# Patient Record
Sex: Female | Born: 1980 | Race: Black or African American | Hispanic: No | Marital: Single | Smoking: Never smoker
Health system: Southern US, Community
[De-identification: ages and names within clinical notes are randomized; demographics above are authoritative.]

## PROBLEM LIST (undated history)

## (undated) DIAGNOSIS — E119 Type 2 diabetes mellitus without complications: Secondary | ICD-10-CM

## (undated) DIAGNOSIS — R0902 Hypoxemia: Secondary | ICD-10-CM

## (undated) DIAGNOSIS — I10 Essential (primary) hypertension: Secondary | ICD-10-CM

## (undated) HISTORY — PX: OOPHORECTOMY: SHX86

---

## 2011-03-08 ENCOUNTER — Inpatient Hospital Stay (HOSPITAL_COMMUNITY): Payer: Medicaid Other

## 2011-03-08 ENCOUNTER — Inpatient Hospital Stay (HOSPITAL_COMMUNITY)
Admission: AD | Admit: 2011-03-08 | Discharge: 2011-03-08 | Disposition: A | Discharge status: Home / Self Care | Payer: Medicaid Other | Source: Ambulatory Visit | Attending: Obstetrics & Gynecology | Admitting: Obstetrics & Gynecology

## 2011-03-08 ENCOUNTER — Emergency Department (HOSPITAL_BASED_OUTPATIENT_CLINIC_OR_DEPARTMENT_OTHER)
Admission: EM | Admit: 2011-03-08 | Discharge: 2011-03-08 | Disposition: A | Discharge status: Other Acute Inpatient Hospital | Payer: Medicaid Other | Attending: Emergency Medicine | Admitting: Emergency Medicine

## 2011-03-08 DIAGNOSIS — O209 Hemorrhage in early pregnancy, unspecified: Secondary | ICD-10-CM

## 2011-03-08 DIAGNOSIS — I10 Essential (primary) hypertension: Secondary | ICD-10-CM | POA: Insufficient documentation

## 2011-03-08 LAB — CBC
HCT: 30.2 % — ABNORMAL LOW (ref 36.0–46.0)
Hemoglobin: 9.5 g/dL — ABNORMAL LOW (ref 12.0–15.0)
MCHC: 31.5 g/dL (ref 30.0–36.0)

## 2011-03-08 LAB — ABO/RH: ABO/RH(D): B POS

## 2011-03-08 LAB — WET PREP, GENITAL
Trich, Wet Prep: NONE SEEN
Yeast Wet Prep HPF POC: NONE SEEN

## 2011-03-08 LAB — PREGNANCY, URINE: Preg Test, Ur: POSITIVE

## 2011-03-11 LAB — GC/CHLAMYDIA PROBE AMP, GENITAL: Chlamydia, DNA Probe: NEGATIVE

## 2014-04-04 ENCOUNTER — Encounter (HOSPITAL_BASED_OUTPATIENT_CLINIC_OR_DEPARTMENT_OTHER): Payer: Self-pay | Admitting: Emergency Medicine

## 2014-04-04 ENCOUNTER — Emergency Department (HOSPITAL_BASED_OUTPATIENT_CLINIC_OR_DEPARTMENT_OTHER)
Admission: EM | Admit: 2014-04-04 | Discharge: 2014-04-04 | Disposition: A | Discharge status: Home / Self Care | Payer: Medicaid Other | Attending: Emergency Medicine | Admitting: Emergency Medicine

## 2014-04-04 DIAGNOSIS — Y929 Unspecified place or not applicable: Secondary | ICD-10-CM | POA: Insufficient documentation

## 2014-04-04 DIAGNOSIS — T18108A Unspecified foreign body in esophagus causing other injury, initial encounter: Secondary | ICD-10-CM | POA: Insufficient documentation

## 2014-04-04 DIAGNOSIS — Y9389 Activity, other specified: Secondary | ICD-10-CM | POA: Insufficient documentation

## 2014-04-04 DIAGNOSIS — IMO0002 Reserved for concepts with insufficient information to code with codable children: Secondary | ICD-10-CM | POA: Insufficient documentation

## 2014-04-04 HISTORY — DX: Morbid (severe) obesity due to excess calories: E66.01

## 2014-04-04 HISTORY — DX: Type 2 diabetes mellitus without complications: E11.9

## 2014-04-04 HISTORY — DX: Essential (primary) hypertension: I10

## 2014-04-04 MED ORDER — ONDANSETRON 8 MG PO TBDP
8.0000 mg | ORAL_TABLET | Freq: Once | ORAL | Status: AC
  Administered 2014-04-04: 8 mg via ORAL
  Filled 2014-04-04: qty 1

## 2014-04-04 NOTE — ED Notes (Signed)
Pt states nausea is gone and is swallowing better

## 2014-04-04 NOTE — ED Provider Notes (Signed)
CSN: [id number]     Arrival date & time 04/04/14  0316 History   First MD Initiated Contact with Patient 04/04/14 0335     Chief Complaint  Patient presents with  . Chest Pain     (Consider location/radiation/quality/duration/timing/severity/associated sxs/prior Treatment) HPI This is a 33 year old female who took her evening medications about 11:30 PM. She immediately felt a sensation that there was something stuck in her upper esophagus. She describes this as a burning sensation, mild to moderate in severity and worse with swallowing. The burning sensation lasted about 30 minutes. She remains able to swallow but still has a foreign body sensation when swallowing liquids. She was not short of breath at any time. She has been nauseated but without vomiting.  No past medical history on file. History reviewed. No pertinent past surgical history. No family history on file. History  Substance Use Topics  . Smoking status: Not on file  . Smokeless tobacco: Not on file  . Alcohol Use: Not on file   OB History   Grav Para Term Preterm Abortions TAB SAB Ect Mult Living                 Review of Systems  All other systems reviewed and are negative.  Allergies  Review of patient's allergies indicates not on file.  Home Medications   Prior to Admission medications   Not on File   BP 150/77  Pulse 96  Temp(Src) 98.2 F (36.8 C) (Oral)  Resp 22  Ht 5\' 9"  (1.753 m)  Wt 460 lb (208.655 kg)  BMI 67.90 kg/m2  SpO2 100%  Physical Exam General: Well-developed, morbidly obese female in no acute distress; appearance consistent with age of record HENT: normocephalic; atraumatic Eyes: pupils equal, round and reactive to light; extraocular muscles intact Neck: supple Heart: regular rate and rhythm Lungs: clear to auscultation bilaterally Abdomen: soft; obese; nontender; bowel sounds present Extremities: No deformity; full range of motion; pulses normal Neurologic: Awake, alert and  oriented; motor function intact in all extremities and symmetric; no facial droop Skin: Warm and dry Psychiatric: Normal mood and affect    ED Course  Procedures (including critical care time)   EKG Interpretation   Date/Time:  Tuesday April 04 2014 03:25:33 EDT Ventricular Rate:  104 PR Interval:  160 QRS Duration: 86 QT Interval:  382 QTC Calculation: 502 R Axis:   58 Text Interpretation:  Sinus tachycardia Otherwise normal ECG No old  tracing to compare Confirmed by Anel Purohit  MD, Jonny Ruiz ([id number]) on 04/04/2014  3:34:32 AM      MDM  4:27 AM Nausea relieved with ODT Zofran.    Hanley Seamen, MD 04/04/14 [phone]

## 2014-04-04 NOTE — ED Notes (Signed)
C/o upper mid chest/throat pain after taking meds  Feels as if pill stuck  Able to swallow water  Denies pain at present

## 2014-04-04 NOTE — ED Notes (Signed)
Pt c/o upper mid chest pain  Onset after taking a pill States feels as if pill stuck in throat  Occurred around 1130 last pm  Is able to swallow water,

## 2014-04-04 NOTE — Discharge Instructions (Signed)
Swallowed Foreign Body, Adult You have swallowed an object (foreign body). Once the foreign body has passed through the food tube (esophagus), which leads from the mouth to the stomach, it will usually continue through the body without problems. This is because the point where the esophagus enters into the stomach is the narrowest place through which the foreign body must pass. Sometimes the foreign body gets stuck. The most common type of foreign body obstruction in adults is food impaction. Many times, bones from fish or meat products may become lodged in the esophagus or injure the throat on the way down. When there is an object that obstructs the esophagus, the most obvious symptoms are pain and the inability to swallow normally.  HOME CARE INSTRUCTIONS   If your caregiver says it is safe for you to eat, then only have liquids and soft foods until your symptoms improve.  Once you are eating normally:  Cut food into small pieces.  Remove small bones from food.  Remove large seeds and pits from fruit.  Chew your food well.  Do not talk, laugh, or engage in physical activity while eating or swallowing. SEEK MEDICAL CARE IF:  You develop worsening shortness of breath, uncontrollable coughing, chest pains or high fever, greater than 102 F (38.9 C).  You are unable to eat or drink or you feel that food is getting stuck in your throat.  You have choking symptoms or cannot stop drooling.  You develop abdominal pain, vomiting (especially of blood), or rectal bleeding. MAKE SURE YOU:   Understand these instructions.  Will watch your condition.  Will get help right away if you are not doing well or get worse. Document Released: 04/09/2010 Document Revised: 01/12/2012 Document Reviewed: 04/09/2010 [facility] Patient Information 2014 [place], [place].

## 2015-03-11 ENCOUNTER — Encounter (HOSPITAL_BASED_OUTPATIENT_CLINIC_OR_DEPARTMENT_OTHER): Payer: Self-pay | Admitting: Emergency Medicine

## 2015-03-11 DIAGNOSIS — E1165 Type 2 diabetes mellitus with hyperglycemia: Secondary | ICD-10-CM | POA: Diagnosis not present

## 2015-03-11 DIAGNOSIS — Z3202 Encounter for pregnancy test, result negative: Secondary | ICD-10-CM | POA: Insufficient documentation

## 2015-03-11 DIAGNOSIS — R251 Tremor, unspecified: Secondary | ICD-10-CM | POA: Diagnosis present

## 2015-03-11 DIAGNOSIS — Z794 Long term (current) use of insulin: Secondary | ICD-10-CM | POA: Insufficient documentation

## 2015-03-11 DIAGNOSIS — Z79899 Other long term (current) drug therapy: Secondary | ICD-10-CM | POA: Diagnosis not present

## 2015-03-11 DIAGNOSIS — I10 Essential (primary) hypertension: Secondary | ICD-10-CM | POA: Diagnosis not present

## 2015-03-11 DIAGNOSIS — Z9119 Patient's noncompliance with other medical treatment and regimen: Secondary | ICD-10-CM | POA: Diagnosis not present

## 2015-03-11 LAB — URINALYSIS, ROUTINE W REFLEX MICROSCOPIC
Bilirubin Urine: NEGATIVE
Hgb urine dipstick: NEGATIVE
KETONES UR: 15 mg/dL — AB
LEUKOCYTES UA: NEGATIVE
NITRITE: NEGATIVE
PH: 5.5 (ref 5.0–8.0)
Protein, ur: NEGATIVE mg/dL
SPECIFIC GRAVITY, URINE: 1.045 — AB (ref 1.005–1.030)
Urobilinogen, UA: 0.2 mg/dL (ref 0.0–1.0)

## 2015-03-11 LAB — CBG MONITORING, ED: GLUCOSE-CAPILLARY: 475 mg/dL — AB (ref 70–99)

## 2015-03-11 LAB — URINE MICROSCOPIC-ADD ON

## 2015-03-11 LAB — PREGNANCY, URINE: PREG TEST UR: NEGATIVE

## 2015-03-11 NOTE — ED Notes (Signed)
Pt states she hasn't taken her BP med in two days and hasn't taken her insulin in over 1 year

## 2015-03-11 NOTE — ED Notes (Signed)
Pt states she has been shaking for several days was going to come last night but her boyfriend came in to get checked out so she decided not to be seen last night.

## 2015-03-11 NOTE — ED Provider Notes (Signed)
CSN: [id number]     Arrival date & time 03/11/15  2220 History  This chart was scribed for Mabell Esguerra, MD by Roxy Cedarhandni Bhalodia, ED Scribe. This patient was seen in room MH08/MH08 and the patient's care was started at 12:37 AM.   Chief Complaint  Patient presents with  . Shaking   Patient is a 34 y.o. female presenting with diabetes problem. The history is provided by the patient. No language interpreter was used.  Diabetes This is a chronic problem. The current episode started more than 1 week ago. The problem occurs constantly. The problem has not changed since onset.Pertinent negatives include no chest pain, no abdominal pain, no headaches and no shortness of breath. Nothing aggravates the symptoms. Nothing relieves the symptoms. She has tried nothing for the symptoms. The treatment provided no relief.    HPI Comments: Rhonda Roth is a 34 y.o. female with a PMHx of diabetes, hypertension and obesity, who presents to the Emergency Department complaining of moderate jitteriness and polyuria that began a few days ago. Patient states that she has not taken her BP medication for the past 2 days and has not taken diabetic medication Novolog over the past year.  Patient is seen by Dr. Julian HyAlexander Borne.   Past Medical History  Diagnosis Date  . Diabetes mellitus without complication   . Hypertension   . Obesities, morbid    History reviewed. No pertinent past surgical history. History reviewed. No pertinent family history. History  Substance Use Topics  . Smoking status: Never Smoker   . Smokeless tobacco: Not on file  . Alcohol Use: No   OB History    No data available     Review of Systems  Constitutional: Negative for fever, diaphoresis and fatigue.  Respiratory: Negative for cough and shortness of breath.   Cardiovascular: Negative for chest pain, palpitations and leg swelling.  Gastrointestinal: Negative for abdominal pain.  Endocrine: Positive for polyuria.  Neurological:  Negative for headaches.  All other systems reviewed and are negative.  Allergies  Tetanus toxoids  Home Medications   Prior to Admission medications   Medication Sig Start Date End Date Taking? Authorizing Provider  insulin aspart (NOVOLOG) 100 UNIT/ML injection Inject 15 Units into the skin 3 (three) times daily before meals.   Yes Historical Provider, MD  insulin detemir (LEVEMIR) 100 UNIT/ML injection Inject into the skin at bedtime.   Yes Historical Provider, MD  Iron Combinations (IRON COMPLEX PO) Take by mouth.   Yes Historical Provider, MD  ramipril (ALTACE) 10 MG capsule Take 10 mg by mouth daily.    Historical Provider, MD  triamterene-hydrochlorothiazide (DYAZIDE) 37.5-25 MG per capsule Take 1 capsule by mouth daily.    Historical Provider, MD   Triage Vitals: BP 189/105 mmHg  Pulse 125  Temp(Src) 98.3 F (36.8 C) (Oral)  Resp 18  Ht 5\' 11"  (1.803 m)  Wt 500 lb (226.799 kg)  BMI 69.77 kg/m2  SpO2 100%  LMP 03/04/2015  Physical Exam  Constitutional: She is oriented to person, place, and time. She appears well-developed and well-nourished.  HENT:  Head: Normocephalic and atraumatic.  Mouth/Throat: Oropharynx is clear and moist. No oropharyngeal exudate.  Eyes: Pupils are equal, round, and reactive to light.  Neck: Neck supple.  Cardiovascular: Normal rate, regular rhythm and normal heart sounds.   Pulmonary/Chest: Effort normal and breath sounds normal. No respiratory distress. She has no wheezes. She has no rales.  Abdominal: Soft. Bowel sounds are normal. There is no tenderness. There  is no rebound and no guarding.  Musculoskeletal: Normal range of motion.  Neurological: She is alert and oriented to person, place, and time. She has normal reflexes.  Skin: Skin is warm and dry.  Psychiatric: She has a normal mood and affect. Her behavior is normal.  Nursing note and vitals reviewed.  ED Course  Procedures (including critical care time)  DIAGNOSTIC  STUDIES: Oxygen Saturation is 100% on RA, normal by my interpretation.    COORDINATION OF CARE: 12:42 AM- Discussed plans to order diagnostic lab work. Pt advised of plan for treatment and pt agrees.  Labs Review Labs Reviewed  URINALYSIS, ROUTINE W REFLEX MICROSCOPIC - Abnormal; Notable for the following:    Specific Gravity, Urine 1.045 (*)    Glucose, UA >1000 (*)    Ketones, ur 15 (*)    All other components within normal limits  CBG MONITORING, ED - Abnormal; Notable for the following:    Glucose-Capillary 475 (*)    All other components within normal limits  PREGNANCY, URINE  URINE MICROSCOPIC-ADD ON   Imaging Review No results found.   EKG Interpretation None     MDM   Final diagnoses:  None    Results for orders placed or performed during the hospital encounter of 03/12/15  Urinalysis, Routine w reflex microscopic  Result Value Ref Range   Color, Urine YELLOW YELLOW   APPearance CLEAR CLEAR   Specific Gravity, Urine 1.045 (H) 1.005 - 1.030   pH 5.5 5.0 - 8.0   Glucose, UA >1000 (A) NEGATIVE mg/dL   Hgb urine dipstick NEGATIVE NEGATIVE   Bilirubin Urine NEGATIVE NEGATIVE   Ketones, ur 15 (A) NEGATIVE mg/dL   Protein, ur NEGATIVE NEGATIVE mg/dL   Urobilinogen, UA 0.2 0.0 - 1.0 mg/dL   Nitrite NEGATIVE NEGATIVE   Leukocytes, UA NEGATIVE NEGATIVE  Pregnancy, urine  Result Value Ref Range   Preg Test, Ur NEGATIVE NEGATIVE  Urine microscopic-add on  Result Value Ref Range   Squamous Epithelial / LPF RARE RARE   WBC, UA 0-2 <3 WBC/hpf   RBC / HPF 0-2 <3 RBC/hpf   Bacteria, UA RARE RARE  CBC with Differential/Platelet  Result Value Ref Range   WBC 7.3 4.0 - 10.5 K/uL   RBC 5.35 (H) 3.87 - 5.11 MIL/uL   Hemoglobin 11.4 (L) 12.0 - 15.0 g/dL   HCT 16.1 (L) 09.6 - 04.5 %   MCV 66.5 (L) 78.0 - 100.0 fL   MCH 21.3 (L) 26.0 - 34.0 pg   MCHC 32.0 30.0 - 36.0 g/dL   RDW 40.9 (H) 81.1 - 91.4 %   Platelets 283 150 - 400 K/uL   Neutrophils Relative % 56 43 - 77  %   Neutro Abs 4.1 1.7 - 7.7 K/uL   Lymphocytes Relative 33 12 - 46 %   Lymphs Abs 2.4 0.7 - 4.0 K/uL   Monocytes Relative 6 3 - 12 %   Monocytes Absolute 0.5 0.1 - 1.0 K/uL   Eosinophils Relative 3 0 - 5 %   Eosinophils Absolute 0.3 0.0 - 0.7 K/uL   Basophils Relative 1 0 - 1 %   Basophils Absolute 0.1 0.0 - 0.1 K/uL   RBC Morphology ELLIPTOCYTES    Smear Review WHITE COUNT CONFIRMED ON SMEAR   Comprehensive metabolic panel  Result Value Ref Range   Sodium 134 (L) 135 - 145 mmol/L   Potassium 4.1 3.5 - 5.1 mmol/L   Chloride 95 (L) 101 - 111 mmol/L   CO2  29 22 - 32 mmol/L   Glucose, Bld 477 (H) 70 - 99 mg/dL   BUN 9 6 - 20 mg/dL   Creatinine, Ser 8.290.86 0.44 - 1.00 mg/dL   Calcium 8.7 (L) 8.9 - 10.3 mg/dL   Total Protein 7.6 6.5 - 8.1 g/dL   Albumin 3.5 3.5 - 5.0 g/dL   AST 26 15 - 41 U/L   ALT 34 14 - 54 U/L   Alkaline Phosphatase 144 (H) 38 - 126 U/L   Total Bilirubin 0.7 0.3 - 1.2 mg/dL   GFR calc non Af Amer >60 >60 mL/min   GFR calc Af Amer >60 >60 mL/min   Anion gap 10 5 - 15  CBG monitoring, ED  Result Value Ref Range   Glucose-Capillary 475 (H) 70 - 99 mg/dL   No results found.  Medications  sodium chloride 0.9 % bolus 1,000 mL (0 mLs Intravenous Stopped 03/12/15 0328)  metFORMIN (GLUCOPHAGE) tablet 1,000 mg (1,000 mg Oral Given 03/12/15 0415)  insulin regular (NOVOLIN R,HUMULIN R) 100 units/mL injection 4 Units (4 Units Subcutaneous Given 03/12/15 0415)   Will restart metformin as will not drop sugar too far too fast and have patient follow up with PMD for HbA1c and ongoing care of DM  I personally performed the services described in this documentation, which was scribed in my presence. The recorded information has been reviewed and is accurate.    Cy BlamerApril Nikeia Henkes, MD 03/12/15 [phone]

## 2015-03-11 NOTE — ED Notes (Signed)
BS 475

## 2015-03-12 ENCOUNTER — Encounter (HOSPITAL_BASED_OUTPATIENT_CLINIC_OR_DEPARTMENT_OTHER): Payer: Self-pay | Admitting: Emergency Medicine

## 2015-03-12 ENCOUNTER — Emergency Department (HOSPITAL_BASED_OUTPATIENT_CLINIC_OR_DEPARTMENT_OTHER)
Admission: EM | Admit: 2015-03-12 | Discharge: 2015-03-12 | Disposition: A | Discharge status: Home / Self Care | Payer: Medicaid Other | Attending: Emergency Medicine | Admitting: Emergency Medicine

## 2015-03-12 DIAGNOSIS — Z9114 Patient's other noncompliance with medication regimen: Secondary | ICD-10-CM

## 2015-03-12 DIAGNOSIS — E1165 Type 2 diabetes mellitus with hyperglycemia: Secondary | ICD-10-CM

## 2015-03-12 LAB — COMPREHENSIVE METABOLIC PANEL
ALT: 34 U/L (ref 14–54)
ANION GAP: 10 (ref 5–15)
AST: 26 U/L (ref 15–41)
Albumin: 3.5 g/dL (ref 3.5–5.0)
Alkaline Phosphatase: 144 U/L — ABNORMAL HIGH (ref 38–126)
BUN: 9 mg/dL (ref 6–20)
CALCIUM: 8.7 mg/dL — AB (ref 8.9–10.3)
CO2: 29 mmol/L (ref 22–32)
CREATININE: 0.86 mg/dL (ref 0.44–1.00)
Chloride: 95 mmol/L — ABNORMAL LOW (ref 101–111)
GLUCOSE: 477 mg/dL — AB (ref 70–99)
Potassium: 4.1 mmol/L (ref 3.5–5.1)
Sodium: 134 mmol/L — ABNORMAL LOW (ref 135–145)
Total Bilirubin: 0.7 mg/dL (ref 0.3–1.2)
Total Protein: 7.6 g/dL (ref 6.5–8.1)

## 2015-03-12 LAB — CBC WITH DIFFERENTIAL/PLATELET
Basophils Absolute: 0.1 10*3/uL (ref 0.0–0.1)
Basophils Relative: 1 % (ref 0–1)
EOS ABS: 0.3 10*3/uL (ref 0.0–0.7)
EOS PCT: 3 % (ref 0–5)
HEMATOCRIT: 35.6 % — AB (ref 36.0–46.0)
HEMOGLOBIN: 11.4 g/dL — AB (ref 12.0–15.0)
LYMPHS ABS: 2.4 10*3/uL (ref 0.7–4.0)
Lymphocytes Relative: 33 % (ref 12–46)
MCH: 21.3 pg — AB (ref 26.0–34.0)
MCHC: 32 g/dL (ref 30.0–36.0)
MCV: 66.5 fL — AB (ref 78.0–100.0)
MONO ABS: 0.5 10*3/uL (ref 0.1–1.0)
MONOS PCT: 6 % (ref 3–12)
Neutro Abs: 4.1 10*3/uL (ref 1.7–7.7)
Neutrophils Relative %: 56 % (ref 43–77)
PLATELETS: 283 10*3/uL (ref 150–400)
RBC: 5.35 MIL/uL — AB (ref 3.87–5.11)
RDW: 16.1 % — ABNORMAL HIGH (ref 11.5–15.5)
WBC: 7.3 10*3/uL (ref 4.0–10.5)

## 2015-03-12 MED ORDER — INSULIN REGULAR HUMAN 100 UNIT/ML IJ SOLN
4.0000 [IU] | Freq: Once | INTRAMUSCULAR | Status: AC
  Administered 2015-03-12: 4 [IU] via SUBCUTANEOUS
  Filled 2015-03-12: qty 1

## 2015-03-12 MED ORDER — METFORMIN HCL 500 MG PO TABS
1000.0000 mg | ORAL_TABLET | Freq: Once | ORAL | Status: AC
  Administered 2015-03-12: 1000 mg via ORAL
  Filled 2015-03-12: qty 2

## 2015-03-12 MED ORDER — METFORMIN HCL 850 MG PO TABS: 850.0000 mg | ORAL_TABLET | Freq: Two times a day (BID) | ORAL | Status: DC

## 2015-03-12 MED ORDER — SODIUM CHLORIDE 0.9 % IV BOLUS (SEPSIS)
1000.0000 mL | Freq: Once | INTRAVENOUS | Status: AC
  Administered 2015-03-12: 1000 mL via INTRAVENOUS

## 2015-03-12 NOTE — ED Notes (Signed)
MD with pt  

## 2015-03-12 NOTE — ED Notes (Signed)
Assumed care of patient from [place], [place]. Pt lying down resting quietly.

## 2015-03-12 NOTE — ED Notes (Signed)
Patient i.v removed per nurse request.

## 2015-03-12 NOTE — Discharge Instructions (Signed)

## 2015-04-04 DIAGNOSIS — R0902 Hypoxemia: Secondary | ICD-10-CM

## 2015-04-04 HISTORY — DX: Hypoxemia: R09.02

## 2015-04-11 ENCOUNTER — Other Ambulatory Visit (HOSPITAL_BASED_OUTPATIENT_CLINIC_OR_DEPARTMENT_OTHER): Payer: Self-pay

## 2015-04-11 ENCOUNTER — Emergency Department (HOSPITAL_BASED_OUTPATIENT_CLINIC_OR_DEPARTMENT_OTHER): Payer: Medicaid Other

## 2015-04-11 ENCOUNTER — Emergency Department (HOSPITAL_BASED_OUTPATIENT_CLINIC_OR_DEPARTMENT_OTHER)
Admit: 2015-04-11 | Discharge: 2015-04-11 | Disposition: A | Discharge status: Home / Self Care | Payer: Medicaid Other | Attending: Emergency Medicine | Admitting: Emergency Medicine

## 2015-04-11 ENCOUNTER — Encounter (HOSPITAL_BASED_OUTPATIENT_CLINIC_OR_DEPARTMENT_OTHER): Payer: Self-pay | Admitting: *Deleted

## 2015-04-11 ENCOUNTER — Inpatient Hospital Stay (HOSPITAL_BASED_OUTPATIENT_CLINIC_OR_DEPARTMENT_OTHER)
Admission: EM | Admit: 2015-04-11 | Discharge: 2015-04-14 | DRG: 175 | Disposition: A | Discharge status: Home / Self Care | Payer: Medicaid Other | Attending: Internal Medicine | Admitting: Internal Medicine

## 2015-04-11 DIAGNOSIS — I2699 Other pulmonary embolism without acute cor pulmonale: Principal | ICD-10-CM | POA: Diagnosis present

## 2015-04-11 DIAGNOSIS — L03116 Cellulitis of left lower limb: Secondary | ICD-10-CM | POA: Diagnosis present

## 2015-04-11 DIAGNOSIS — I10 Essential (primary) hypertension: Secondary | ICD-10-CM | POA: Diagnosis present

## 2015-04-11 DIAGNOSIS — J9601 Acute respiratory failure with hypoxia: Secondary | ICD-10-CM | POA: Diagnosis present

## 2015-04-11 DIAGNOSIS — Z6841 Body Mass Index (BMI) 40.0 and over, adult: Secondary | ICD-10-CM

## 2015-04-11 DIAGNOSIS — R0602 Shortness of breath: Secondary | ICD-10-CM | POA: Diagnosis present

## 2015-04-11 DIAGNOSIS — R0600 Dyspnea, unspecified: Secondary | ICD-10-CM | POA: Diagnosis not present

## 2015-04-11 DIAGNOSIS — Z794 Long term (current) use of insulin: Secondary | ICD-10-CM | POA: Diagnosis not present

## 2015-04-11 DIAGNOSIS — T502X5A Adverse effect of carbonic-anhydrase inhibitors, benzothiadiazides and other diuretics, initial encounter: Secondary | ICD-10-CM | POA: Diagnosis not present

## 2015-04-11 DIAGNOSIS — E119 Type 2 diabetes mellitus without complications: Secondary | ICD-10-CM | POA: Diagnosis present

## 2015-04-11 DIAGNOSIS — E876 Hypokalemia: Secondary | ICD-10-CM | POA: Diagnosis present

## 2015-04-11 DIAGNOSIS — Z887 Allergy status to serum and vaccine status: Secondary | ICD-10-CM | POA: Diagnosis not present

## 2015-04-11 DIAGNOSIS — Z713 Dietary counseling and surveillance: Secondary | ICD-10-CM | POA: Diagnosis present

## 2015-04-11 DIAGNOSIS — R0902 Hypoxemia: Secondary | ICD-10-CM | POA: Diagnosis not present

## 2015-04-11 HISTORY — DX: Hypoxemia: R09.02

## 2015-04-11 LAB — COMPREHENSIVE METABOLIC PANEL
ALK PHOS: 143 U/L — AB (ref 38–126)
ALT: 40 U/L (ref 14–54)
AST: 34 U/L (ref 15–41)
Albumin: 3.4 g/dL — ABNORMAL LOW (ref 3.5–5.0)
Anion gap: 8 (ref 5–15)
BILIRUBIN TOTAL: 0.2 mg/dL — AB (ref 0.3–1.2)
BUN: 11 mg/dL (ref 6–20)
CALCIUM: 8.1 mg/dL — AB (ref 8.9–10.3)
CHLORIDE: 100 mmol/L — AB (ref 101–111)
CO2: 25 mmol/L (ref 22–32)
Creatinine, Ser: 1.02 mg/dL — ABNORMAL HIGH (ref 0.44–1.00)
GLUCOSE: 343 mg/dL — AB (ref 65–99)
POTASSIUM: 3.5 mmol/L (ref 3.5–5.1)
Sodium: 133 mmol/L — ABNORMAL LOW (ref 135–145)
TOTAL PROTEIN: 7.8 g/dL (ref 6.5–8.1)

## 2015-04-11 LAB — URINE MICROSCOPIC-ADD ON

## 2015-04-11 LAB — DIFFERENTIAL
BASOS PCT: 1 % (ref 0–1)
Basophils Absolute: 0.1 10*3/uL (ref 0.0–0.1)
EOS ABS: 0.4 10*3/uL (ref 0.0–0.7)
EOS PCT: 5 % (ref 0–5)
LYMPHS ABS: 2.6 10*3/uL (ref 0.7–4.0)
LYMPHS PCT: 32 % (ref 12–46)
MONOS PCT: 8 % (ref 3–12)
Monocytes Absolute: 0.6 10*3/uL (ref 0.1–1.0)
Neutro Abs: 4.4 10*3/uL (ref 1.7–7.7)
Neutrophils Relative %: 55 % (ref 43–77)

## 2015-04-11 LAB — GLUCOSE, CAPILLARY: Glucose-Capillary: 256 mg/dL — ABNORMAL HIGH (ref 65–99)

## 2015-04-11 LAB — URINALYSIS, ROUTINE W REFLEX MICROSCOPIC
Bilirubin Urine: NEGATIVE
Hgb urine dipstick: NEGATIVE
Ketones, ur: NEGATIVE mg/dL
Nitrite: NEGATIVE
Protein, ur: NEGATIVE mg/dL
SPECIFIC GRAVITY, URINE: 1.035 — AB (ref 1.005–1.030)
UROBILINOGEN UA: 1 mg/dL (ref 0.0–1.0)
pH: 6 (ref 5.0–8.0)

## 2015-04-11 LAB — CBC
HEMATOCRIT: 37 % (ref 36.0–46.0)
Hemoglobin: 11.6 g/dL — ABNORMAL LOW (ref 12.0–15.0)
MCH: 21.5 pg — AB (ref 26.0–34.0)
MCHC: 31.4 g/dL (ref 30.0–36.0)
MCV: 68.6 fL — AB (ref 78.0–100.0)
PLATELETS: 287 10*3/uL (ref 150–400)
RBC: 5.39 MIL/uL — AB (ref 3.87–5.11)
RDW: 18.3 % — AB (ref 11.5–15.5)
WBC: 8 10*3/uL (ref 4.0–10.5)

## 2015-04-11 LAB — TROPONIN I: Troponin I: 0.03 ng/mL (ref <0.031)

## 2015-04-11 LAB — BRAIN NATRIURETIC PEPTIDE: B Natriuretic Peptide: 140.5 pg/mL — ABNORMAL HIGH (ref 0.0–100.0)

## 2015-04-11 LAB — PREGNANCY, URINE: PREG TEST UR: NEGATIVE

## 2015-04-11 LAB — D-DIMER, QUANTITATIVE (NOT AT ARMC): D DIMER QUANT: 1.58 ug{FEU}/mL — AB (ref 0.00–0.48)

## 2015-04-11 MED ORDER — INSULIN DETEMIR 100 UNIT/ML ~~LOC~~ SOLN
50.0000 [IU] | Freq: Every day | SUBCUTANEOUS | Status: DC
  Administered 2015-04-11 – 2015-04-12 (×2): 50 [IU] via SUBCUTANEOUS
  Filled 2015-04-11 (×3): qty 0.5

## 2015-04-11 MED ORDER — SODIUM CHLORIDE 0.45 % IV SOLN
INTRAVENOUS | Status: AC
  Administered 2015-04-11: via INTRAVENOUS

## 2015-04-11 MED ORDER — HEPARIN (PORCINE) IN NACL 100-0.45 UNIT/ML-% IJ SOLN
2450.0000 [IU]/h | INTRAMUSCULAR | Status: AC
  Administered 2015-04-11 – 2015-04-12 (×2): 2700 [IU]/h via INTRAVENOUS
  Administered 2015-04-12: 2600 [IU]/h via INTRAVENOUS
  Administered 2015-04-13: 2450 [IU]/h via INTRAVENOUS
  Filled 2015-04-11 (×7): qty 250

## 2015-04-11 MED ORDER — ALBUTEROL SULFATE (2.5 MG/3ML) 0.083% IN NEBU: 2.5000 mg | INHALATION_SOLUTION | RESPIRATORY_TRACT | Status: DC | PRN

## 2015-04-11 MED ORDER — ONDANSETRON HCL 4 MG PO TABS: 4.0000 mg | ORAL_TABLET | Freq: Four times a day (QID) | ORAL | Status: DC | PRN

## 2015-04-11 MED ORDER — INSULIN ASPART 100 UNIT/ML ~~LOC~~ SOLN
0.0000 [IU] | Freq: Three times a day (TID) | SUBCUTANEOUS | Status: DC
  Administered 2015-04-12 – 2015-04-13 (×4): 7 [IU] via SUBCUTANEOUS
  Administered 2015-04-13 – 2015-04-14 (×3): 4 [IU] via SUBCUTANEOUS
  Administered 2015-04-14: 7 [IU] via SUBCUTANEOUS

## 2015-04-11 MED ORDER — ACETAMINOPHEN 650 MG RE SUPP: 650.0000 mg | Freq: Four times a day (QID) | RECTAL | Status: DC | PRN

## 2015-04-11 MED ORDER — INSULIN ASPART 100 UNIT/ML ~~LOC~~ SOLN
0.0000 [IU] | Freq: Every day | SUBCUTANEOUS | Status: DC
  Administered 2015-04-11: 3 [IU] via SUBCUTANEOUS
  Administered 2015-04-12: 2 [IU] via SUBCUTANEOUS

## 2015-04-11 MED ORDER — SULFAMETHOXAZOLE-TRIMETHOPRIM 800-160 MG PO TABS
1.0000 | ORAL_TABLET | Freq: Two times a day (BID) | ORAL | Status: AC
  Administered 2015-04-11 – 2015-04-13 (×4): 1 via ORAL
  Filled 2015-04-11 (×4): qty 1

## 2015-04-11 MED ORDER — SODIUM CHLORIDE 0.9 % IJ SOLN
3.0000 mL | Freq: Two times a day (BID) | INTRAMUSCULAR | Status: DC
  Administered 2015-04-11 – 2015-04-14 (×6): 3 mL via INTRAVENOUS

## 2015-04-11 MED ORDER — ACETAMINOPHEN 325 MG PO TABS: 650.0000 mg | ORAL_TABLET | Freq: Four times a day (QID) | ORAL | Status: DC | PRN

## 2015-04-11 MED ORDER — ONDANSETRON HCL 4 MG/2ML IJ SOLN: 4.0000 mg | Freq: Four times a day (QID) | INTRAMUSCULAR | Status: DC | PRN

## 2015-04-11 MED ORDER — INSULIN ASPART 100 UNIT/ML ~~LOC~~ SOLN: 6.0000 [IU] | Freq: Three times a day (TID) | SUBCUTANEOUS | Status: DC

## 2015-04-11 MED ORDER — HEPARIN BOLUS VIA INFUSION
5000.0000 [IU] | Freq: Once | INTRAVENOUS | Status: AC
  Administered 2015-04-11: 5000 [IU] via INTRAVENOUS

## 2015-04-11 MED ORDER — RAMIPRIL 10 MG PO CAPS
10.0000 mg | ORAL_CAPSULE | Freq: Every day | ORAL | Status: DC
  Administered 2015-04-12 – 2015-04-14 (×3): 10 mg via ORAL
  Filled 2015-04-11 (×3): qty 1

## 2015-04-11 MED ORDER — INSULIN ASPART 100 UNIT/ML ~~LOC~~ SOLN
6.0000 [IU] | Freq: Three times a day (TID) | SUBCUTANEOUS | Status: DC
  Administered 2015-04-12 – 2015-04-13 (×4): 6 [IU] via SUBCUTANEOUS

## 2015-04-11 NOTE — ED Notes (Signed)
Pa  at bedside. 

## 2015-04-11 NOTE — ED Provider Notes (Signed)
CSN: [id number]     Arrival date & time 04/11/15  1557 History   First MD Initiated Contact with Patient 04/11/15 1559     Chief Complaint  Patient presents with  . Shortness of Breath     (Consider location/radiation/quality/duration/timing/severity/associated sxs/prior Treatment) HPI Comments: Patient is a 34 year old female past medical history significant for DM, HTN presented to the emergency department from her primary care physician for evaluation of 4 days of worsening shortness of breath. Patient endorses an associated intermittent nonproductive cough. She states her shortness of breath is worsened with exertion or lying flat. She states she has been treated for a left leg cellulitis and has noticed some swelling to the area, improving while on Bactrim, but endorses feeling "like a tube is in there." She states while she has been on the antibiotic she has had decreased mobility. Denies any associated fevers, chest pain, nausea, vomiting, diaphoresis. No modifying factors identified. No history of DVT or PE. Patient does not wear home oxygen.  Patient is a 34 y.o. female presenting with shortness of breath. The history is provided by the patient.  Shortness of Breath Severity:  Severe Onset quality:  Sudden Duration:  4 days Progression:  Worsening Relieved by:  Nothing Worsened by:  Movement and exertion Ineffective treatments:  None tried Associated symptoms: cough   Associated symptoms: no chest pain, no diaphoresis, no fever, no headaches, no syncope, no vomiting and no wheezing   Risk factors: obesity   Risk factors: no hx of PE/DVT, no oral contraceptive use and no recent surgery     Past Medical History  Diagnosis Date  . Diabetes mellitus without complication   . Hypertension   . Obesities, morbid    History reviewed. No pertinent past surgical history. History reviewed. No pertinent family history. History  Substance Use Topics  . Smoking status: Never Smoker   .  Smokeless tobacco: Not on file  . Alcohol Use: No   OB History    No data available     Review of Systems  Constitutional: Negative for fever and diaphoresis.  Respiratory: Positive for cough and shortness of breath. Negative for wheezing.   Cardiovascular: Negative for chest pain and syncope.  Gastrointestinal: Negative for vomiting.  Neurological: Negative for headaches.      Allergies  Tetanus toxoids  Home Medications   Prior to Admission medications   Medication Sig Start Date End Date Taking? Authorizing Provider  insulin aspart (NOVOLOG) 100 UNIT/ML injection Inject 15 Units into the skin 3 (three) times daily before meals.    Historical Provider, MD  insulin detemir (LEVEMIR) 100 UNIT/ML injection Inject into the skin at bedtime.    Historical Provider, MD  Iron Combinations (IRON COMPLEX PO) Take by mouth.    Historical Provider, MD  ramipril (ALTACE) 10 MG capsule Take 10 mg by mouth daily.    Historical Provider, MD  triamterene-hydrochlorothiazide (DYAZIDE) 37.5-25 MG per capsule Take 1 capsule by mouth daily.    Historical Provider, MD   BP 124/81 mmHg  Pulse 102  Temp(Src) 98.1 F (36.7 C) (Oral)  Resp 18  Ht  (1.803 m)  Wt 517 lb 3.2 oz (234.6 kg)  BMI 72.17 kg/m2  SpO2 99%  LMP 04/04/2015 Physical Exam  Constitutional: She is oriented to person, place, and time. She appears well-developed and well-nourished. No distress. Nasal cannula in place.  Examination limited by body habitus.   HENT:  Head: Normocephalic and atraumatic.  Right Ear: External ear normal.  Left Ear: External ear normal.  Nose: Nose normal.  Mouth/Throat: Oropharynx is clear and moist.  Eyes: Conjunctivae are normal.  Neck: Normal range of motion. Neck supple.  No nuchal rigidity.   Cardiovascular: Regular rhythm and normal heart sounds.  Tachycardia present.   Pulmonary/Chest: Effort normal and breath sounds normal. She has no wheezes.  Abdominal: Soft. There is no  tenderness.  Musculoskeletal: Normal range of motion. She exhibits no edema.  Neurological: She is alert and oriented to person, place, and time.  Skin: Skin is warm and dry. She is not diaphoretic.     Psychiatric: She has a normal mood and affect.  Nursing note and vitals reviewed.   ED Course  Procedures (including critical care time) Medications  heparin ADULT infusion 100 units/mL (25000 units/250 mL) (2,700 Units/hr Intravenous New Bag/Given 04/11/15 1945)  heparin bolus via infusion 5,000 Units (5,000 Units Intravenous Given 04/11/15 1945)    Labs Review Labs Reviewed  CBC - Abnormal; Notable for the following:    RBC 5.39 (*)    Hemoglobin 11.6 (*)    MCV 68.6 (*)    MCH 21.5 (*)    RDW 18.3 (*)    All other components within normal limits  COMPREHENSIVE METABOLIC PANEL - Abnormal; Notable for the following:    Sodium 133 (*)    Chloride 100 (*)    Glucose, Bld 343 (*)    Creatinine, Ser 1.02 (*)    Calcium 8.1 (*)    Albumin 3.4 (*)    Alkaline Phosphatase 143 (*)    Total Bilirubin 0.2 (*)    All other components within normal limits  BRAIN NATRIURETIC PEPTIDE - Abnormal; Notable for the following:    B Natriuretic Peptide 140.5 (*)    All other components within normal limits  URINALYSIS, ROUTINE W REFLEX MICROSCOPIC (NOT AT [facility]) - Abnormal; Notable for the following:    APPearance CLOUDY (*)    Specific Gravity, Urine 1.035 (*)    Glucose, UA >1000 (*)    Leukocytes, UA SMALL (*)    All other components within normal limits  D-DIMER, QUANTITATIVE (NOT AT [facility]) - Abnormal; Notable for the following:    D-Dimer, Quant 1.58 (*)    All other components within normal limits  TROPONIN I  PREGNANCY, URINE  DIFFERENTIAL  URINE MICROSCOPIC-ADD ON  HEPARIN LEVEL (UNFRACTIONATED)  CBC    Imaging Review [place] Venous Img Lower Bilateral  04/11/2015   CLINICAL DATA:  Shortness of breath for 4 days. LEFT lower extremity pain. Previous skin infection.  EXAM: BILATERAL  LOWER EXTREMITY VENOUS DOPPLER ULTRASOUND  TECHNIQUE: Gray-scale sonography with graded compression, as well as color Doppler and duplex ultrasound were performed to evaluate the lower extremity deep venous systems from the level of the common femoral vein and including the common femoral, femoral, profunda femoral, popliteal and calf veins including the posterior tibial, peroneal and gastrocnemius veins when visible. The superficial great saphenous vein was also interrogated. Spectral Doppler was utilized to evaluate flow at rest and with distal augmentation maneuvers in the common femoral, femoral and popliteal veins.  COMPARISON:  None.  FINDINGS: RIGHT LOWER EXTREMITY  Common Femoral Vein: No evidence of thrombus. Normal compressibility, respiratory phasicity and response to augmentation.  Saphenofemoral Junction: No evidence of thrombus. Normal compressibility and flow on color Doppler imaging.  Profunda Femoral Vein: No evidence of thrombus. Normal compressibility and flow on color Doppler imaging.  Femoral Vein: No evidence of thrombus. Normal compressibility, respiratory phasicity and response  to augmentation.  Popliteal Vein: No evidence of thrombus. Normal compressibility, respiratory phasicity and response to augmentation.  Calf Veins: No evidence of thrombus. Normal compressibility and flow on color Doppler imaging.  Superficial Great Saphenous Vein: No evidence of thrombus. Normal compressibility and flow on color Doppler imaging.  Venous Reflux:  None.  Other Findings:  None.  LEFT LOWER EXTREMITY  Common Femoral Vein: No evidence of thrombus. Normal compressibility, respiratory phasicity and response to augmentation.  Saphenofemoral Junction: No evidence of thrombus. Normal compressibility and flow on color Doppler imaging.  Profunda Femoral Vein: No evidence of thrombus. Normal compressibility and flow on color Doppler imaging.  Femoral Vein: No evidence of thrombus. Normal compressibility,  respiratory phasicity and response to augmentation.  Popliteal Vein: No evidence of thrombus. Normal compressibility, respiratory phasicity and response to augmentation.  Calf Veins: No evidence of thrombus. Normal compressibility and flow on color Doppler imaging.  Superficial Great Saphenous Vein: Intraluminal thrombus extends from the mid calf to the SFJ.  Venous Reflux:  None.  Other Findings:  None.  IMPRESSION: No evidence for RIGHT or LEFT deep venous thrombosis.  Superficial venous thrombosis involving the superficial great saphenous vein on the LEFT. Intraluminal thrombus extends from the mid calf to the SFJ.   Electronically Signed   By: Davonna Belling M.D.   On: 04/11/2015 20:13   Dg Chest Portable 1 View  04/11/2015   CLINICAL DATA:  34 year old female with shortness of breath for 4 days. Initial encounter.  EXAM: PORTABLE CHEST - 1 VIEW  COMPARISON:  [facility] chest radiographs 12/18/2009 report (no images available).  FINDINGS: Portable AP view at 1631 hours. Large body habitus. Normal cardiac size and mediastinal contours. Visualized tracheal air column is within normal limits. Allowing for portable technique, the lungs are clear. No pneumothorax or pleural effusion identified.  IMPRESSION: No acute cardiopulmonary abnormality.   Electronically Signed   By: Odessa Fleming M.D.   On: 04/11/2015 16:48     EKG Interpretation None      Patient dropped oxygen saturations to 77%-80% while changing into gown on RA.   Discussed patient with Dr. Rito Ehrlich of [facility] who will admit the patient in a transfer.   CRITICAL CARE Performed by: Francee Piccolo L   Total critical care time: 45 minutes  Critical care time was exclusive of separately billable procedures and treating other patients.  Critical care was necessary to treat or prevent imminent or life-threatening deterioration.  Critical care was time spent personally by me on the following activities: development of  treatment plan with patient and/or surrogate as well as nursing, discussions with consultants, evaluation of patient's response to treatment, examination of patient, obtaining history from patient or surrogate, ordering and performing treatments and interventions, ordering and review of laboratory studies, ordering and review of radiographic studies, pulse oximetry and re-evaluation of patient's condition.  MDM   Final diagnoses:  Hypoxia    Filed Vitals:   04/11/15 1835  BP: 124/81  Pulse: 102  Temp: 98.1 F (36.7 C)  Resp: 18    I have reviewed nursing notes, vital signs, and all appropriate lab and imaging results if ordered as above.   Patient presented to the emergency department from her primary care office for 4 days of worsening shortness of breath. On arrival she is afebrile, tachycardic to mid 110s to 120s and hypoxic on room air to 88%. Hypoxia improved on 2 L of nasal cannula. Lungs clear to auscultation. No obvious unilateral LE  edema or swelling. CXR reviewed. EKG reviewed. D-dimer elevated and [place] ordered wit evidence of large thrombus. Given concern from history and physical with lab findings suspicious for possible PE. Patient will require VQ scan for further evaluation of PE. Heparin will be ordered. Patient will be transferred and admitted to [facility] for further management and evaluation.  Patient d/w with Dr. Fayrene Fearing, agrees with plan.    Francee Piccolo, PA-C 04/11/15 2022  Rolland Porter, MD 04/20/15 [phone]

## 2015-04-11 NOTE — ED Notes (Signed)
Attempt to call report nurse not available 

## 2015-04-11 NOTE — ED Notes (Signed)
Pt brought in by ems form MD office for SOB x 4 days

## 2015-04-11 NOTE — H&P (Addendum)
[facility] History and Physical  Rhonda Roth ENI:[id number] DOB: 11-11-1980 DOA: 04/11/2015   PCP: Johnnette Barrios, MD  Specialists: None  Chief Complaint: Shortness of breath since Saturday  HPI: Rhonda Roth is a 34 y.o. female with a past medical history of for diabetes on insulin, morbid obesity, hypertension, who was in her usual state of health about 2-3 weeks ago when she started developing pain and swelling in the left leg, especially in the thigh area. She was noted to have symptoms suggestive of cellulitis. She was started on antibiotics. Initial antibiotics did not work so she was changed over to Bactrim. She still has about 2-3 days of antibiotics left. As a result of the cellulitis patient has had decreased mobility over the last of 10 days or so. On Saturday she wanted to get up and move around. She felt short of breath. This was mainly with exertion. She decided to wait out her symptoms. But the symptoms did not get any better. So she called her primary care physician's and was made an appointment for today. She was sent over to the emergency department. Over in the emergency department she was found to be tachycardic to 110s to 120s. Oxygen saturation was 88% on room air. Subsequently, we were called for hospitalization. Patient denies any chest pain, dizziness, nausea, vomiting. She's never had similar symptoms before. She denies any history of lung disease, heart disease. Denies any fever, chills. She did have one episode of cough which was dry. Did have some palpitations.  Home Medications: Prior to Admission medications   Medication Sig Start Date End Date Taking? Authorizing Provider  insulin aspart (NOVOLOG) 100 UNIT/ML injection Inject 15 Units into the skin 3 (three) times daily before meals.    Historical Provider, MD  insulin detemir (LEVEMIR) 100 UNIT/ML injection Inject into the skin at bedtime.    Historical Provider, MD  Iron Combinations (IRON  COMPLEX PO) Take by mouth.    Historical Provider, MD  ramipril (ALTACE) 10 MG capsule Take 10 mg by mouth daily.    Historical Provider, MD  triamterene-hydrochlorothiazide (DYAZIDE) 37.5-25 MG per capsule Take 1 capsule by mouth daily.    Historical Provider, MD    Allergies:  Allergies  Allergen Reactions  . Tetanus Toxoids Rash    Past Medical History: Past Medical History  Diagnosis Date  . Diabetes mellitus without complication   . Hypertension   . Obesities, morbid     She's had a right ovarian cyst and has had oophorectomy.  Social History: She lives in [place]. Denies smoking, alcohol use or illicit drug use. Usually she is independent.  Family History:  She mentions a history of diabetes in her grandfather. History of blood clots in her grandmother.  Review of Systems - History obtained from the patient General ROS: positive for  - fatigue Psychological ROS: negative Ophthalmic ROS: negative ENT ROS: negative Allergy and Immunology ROS: negative Hematological and Lymphatic ROS: negative Endocrine ROS: negative Respiratory ROS: as in hpi Cardiovascular ROS: as in hpi Gastrointestinal ROS: no abdominal pain, change in bowel habits, or black or bloody stools Genito-Urinary ROS: no dysuria, trouble voiding, or hematuria Musculoskeletal ROS: negative Neurological ROS: no TIA or stroke symptoms Dermatological ROS: negative  Physical Examination  Filed Vitals:   04/11/15 1605 04/11/15 1805 04/11/15 1835 04/11/15 2046  BP:   124/81 134/76  Pulse:   102 110  Temp:   98.1 F (36.7 C) 98.2 F (36.8 C)  TempSrc:   Oral  Oral  Resp:   18 18  Height:    _0  (1.803 m)  Weight:  234.6 kg (517 lb 3.2 oz)  234.328 kg (516 lb 9.6 oz)  SpO2: 100%  99% 98%    BP 134/76 mmHg  Pulse 110  Temp(Src) 98.2 F (36.8 C) (Oral)  Resp 18  Ht _1  (1.803 m)  Wt 234.328 kg (516 lb 9.6 oz)  BMI 72.08 kg/m2  SpO2 98%  LMP 04/04/2015  General appearance: alert,  cooperative, appears stated age, no distress and morbidly obese Head: Normocephalic, without obvious abnormality, atraumatic Eyes: conjunctivae/corneas clear. PERRL, EOM's intact.  Throat: lips, mucosa, and tongue normal; teeth and gums normal Neck: no adenopathy, no carotid bruit, no JVD, supple, symmetrical, trachea midline and thyroid not enlarged, symmetric, no tenderness/mass/nodules Back: symmetric, no curvature. ROM normal. No CVA tenderness. Resp: clear to auscultation bilaterally Cardio: regular rate and rhythm, S1, S2 normal, no murmur, click, rub or gallop GI: soft, non-tender; bowel sounds normal; no masses,  no organomegaly Extremities: extremities normal, atraumatic, no cyanosis or edema Pulses: 2+ and symmetric Skin: Skin color, texture, turgor normal. No rashes or lesions Lymph nodes: Cervical, supraclavicular, and axillary nodes normal. Neurologic: No focal deficits.  Laboratory Data: Results for orders placed or performed during the hospital encounter of 04/11/15 (from the past 48 hour(s))  CBC     Status: Abnormal   Collection Time: 04/11/15  4:00 PM  Result Value Ref Range   WBC 8.0 4.0 - 10.5 K/uL   RBC 5.39 (H) 3.87 - 5.11 MIL/uL   Hemoglobin 11.6 (L) 12.0 - 15.0 g/dL   HCT 37.0 36.0 - 46.0 %   MCV 68.6 (L) 78.0 - 100.0 fL   MCH 21.5 (L) 26.0 - 34.0 pg   MCHC 31.4 30.0 - 36.0 g/dL   RDW 18.3 (H) 11.5 - 15.5 %   Platelets 287 150 - 400 K/uL  Comprehensive metabolic panel     Status: Abnormal   Collection Time: 04/11/15  4:00 PM  Result Value Ref Range   Sodium 133 (L) 135 - 145 mmol/L   Potassium 3.5 3.5 - 5.1 mmol/L   Chloride 100 (L) 101 - 111 mmol/L   CO2 25 22 - 32 mmol/L   Glucose, Bld 343 (H) 65 - 99 mg/dL   BUN 11 6 - 20 mg/dL   Creatinine, Ser 1.02 (H) 0.44 - 1.00 mg/dL   Calcium 8.1 (L) 8.9 - 10.3 mg/dL   Total Protein 7.8 6.5 - 8.1 g/dL   Albumin 3.4 (L) 3.5 - 5.0 g/dL   AST 34 15 - 41 U/L   ALT 40 14 - 54 U/L   Alkaline Phosphatase 143 (H)  38 - 126 U/L   Total Bilirubin 0.2 (L) 0.3 - 1.2 mg/dL   GFR calc non Af Amer >60 >60 mL/min   GFR calc Af Amer >60 >60 mL/min    Comment: (NOTE) The eGFR has been calculated using the CKD EPI equation. This calculation has not been validated in all clinical situations. eGFR's persistently <60 mL/min signify possible Chronic Kidney Disease.    Anion gap 8 5 - 15  Brain natriuretic peptide     Status: Abnormal   Collection Time: 04/11/15  4:00 PM  Result Value Ref Range   B Natriuretic Peptide 140.5 (H) 0.0 - 100.0 pg/mL  Troponin I     Status: None   Collection Time: 04/11/15  4:00 PM  Result Value Ref Range   Troponin I 0.03 <  0.031 ng/mL    Comment:        NO INDICATION OF MYOCARDIAL INJURY.   Differential     Status: None   Collection Time: 04/11/15  4:00 PM  Result Value Ref Range   Neutrophils Relative % 55 43 - 77 %   Neutro Abs 4.4 1.7 - 7.7 K/uL   Lymphocytes Relative 32 12 - 46 %   Lymphs Abs 2.6 0.7 - 4.0 K/uL   Monocytes Relative 8 3 - 12 %   Monocytes Absolute 0.6 0.1 - 1.0 K/uL   Eosinophils Relative 5 0 - 5 %   Eosinophils Absolute 0.4 0.0 - 0.7 K/uL   Basophils Relative 1 0 - 1 %   Basophils Absolute 0.1 0.0 - 0.1 K/uL  D-dimer, quantitative     Status: Abnormal   Collection Time: 04/11/15  4:07 PM  Result Value Ref Range   D-Dimer, Quant 1.58 (H) 0.00 - 0.48 ug/mL-FEU    Comment:        AT THE INHOUSE ESTABLISHED CUTOFF VALUE OF 0.48 ug/mL FEU, THIS ASSAY HAS BEEN DOCUMENTED IN THE LITERATURE TO HAVE A SENSITIVITY AND NEGATIVE PREDICTIVE VALUE OF AT LEAST 98 TO 99%.  THE TEST RESULT SHOULD BE CORRELATED WITH AN ASSESSMENT OF THE CLINICAL PROBABILITY OF DVT / VTE.   Pregnancy, urine     Status: None   Collection Time: 04/11/15  6:00 PM  Result Value Ref Range   Preg Test, Ur NEGATIVE NEGATIVE    Comment:        THE SENSITIVITY OF THIS METHODOLOGY IS >20 mIU/mL.   Urinalysis, Routine w reflex microscopic (not at [facility])     Status: Abnormal    Collection Time: 04/11/15  6:00 PM  Result Value Ref Range   Color, Urine YELLOW YELLOW   APPearance CLOUDY (A) CLEAR   Specific Gravity, Urine 1.035 (H) 1.005 - 1.030   pH 6.0 5.0 - 8.0   Glucose, UA >1000 (A) NEGATIVE mg/dL   Hgb urine dipstick NEGATIVE NEGATIVE   Bilirubin Urine NEGATIVE NEGATIVE   Ketones, ur NEGATIVE NEGATIVE mg/dL   Protein, ur NEGATIVE NEGATIVE mg/dL   Urobilinogen, UA 1.0 0.0 - 1.0 mg/dL   Nitrite NEGATIVE NEGATIVE   Leukocytes, UA SMALL (A) NEGATIVE  Urine microscopic-add on     Status: None   Collection Time: 04/11/15  6:00 PM  Result Value Ref Range   Squamous Epithelial / LPF RARE RARE   WBC, UA 3-6 <3 WBC/hpf   RBC / HPF 0-2 <3 RBC/hpf   Bacteria, UA RARE RARE   Urine-Other MUCOUS PRESENT     Radiology Reports: US Venous Img Lower Bilateral  04/11/2015   CLINICAL DATA:  Shortness of breath for 4 days. LEFT lower extremity pain. Previous skin infection.  EXAM: BILATERAL LOWER EXTREMITY VENOUS DOPPLER ULTRASOUND  TECHNIQUE: Gray-scale sonography with graded compression, as well as color Doppler and duplex ultrasound were performed to evaluate the lower extremity deep venous systems from the level of the common femoral vein and including the common femoral, femoral, profunda femoral, popliteal and calf veins including the posterior tibial, peroneal and gastrocnemius veins when visible. The superficial great saphenous vein was also interrogated. Spectral Doppler was utilized to evaluate flow at rest and with distal augmentation maneuvers in the common femoral, femoral and popliteal veins.  COMPARISON:  None.  FINDINGS: RIGHT LOWER EXTREMITY  Common Femoral Vein: No evidence of thrombus. Normal compressibility, respiratory phasicity and response to augmentation.  Saphenofemoral Junction: No evidence of  thrombus. Normal compressibility and flow on color Doppler imaging.  Profunda Femoral Vein: No evidence of thrombus. Normal compressibility and flow on color Doppler  imaging.  Femoral Vein: No evidence of thrombus. Normal compressibility, respiratory phasicity and response to augmentation.  Popliteal Vein: No evidence of thrombus. Normal compressibility, respiratory phasicity and response to augmentation.  Calf Veins: No evidence of thrombus. Normal compressibility and flow on color Doppler imaging.  Superficial Great Saphenous Vein: No evidence of thrombus. Normal compressibility and flow on color Doppler imaging.  Venous Reflux:  None.  Other Findings:  None.  LEFT LOWER EXTREMITY  Common Femoral Vein: No evidence of thrombus. Normal compressibility, respiratory phasicity and response to augmentation.  Saphenofemoral Junction: No evidence of thrombus. Normal compressibility and flow on color Doppler imaging.  Profunda Femoral Vein: No evidence of thrombus. Normal compressibility and flow on color Doppler imaging.  Femoral Vein: No evidence of thrombus. Normal compressibility, respiratory phasicity and response to augmentation.  Popliteal Vein: No evidence of thrombus. Normal compressibility, respiratory phasicity and response to augmentation.  Calf Veins: No evidence of thrombus. Normal compressibility and flow on color Doppler imaging.  Superficial Great Saphenous Vein: Intraluminal thrombus extends from the mid calf to the SFJ.  Venous Reflux:  None.  Other Findings:  None.  IMPRESSION: No evidence for RIGHT or LEFT deep venous thrombosis.  Superficial venous thrombosis involving the superficial great saphenous vein on the LEFT. Intraluminal thrombus extends from the mid calf to the SFJ.   Electronically Signed   By: Rolla Flatten M.D.   On: 04/11/2015 20:13   Dg Chest Portable 1 View  04/11/2015   CLINICAL DATA:  34 year old female with shortness of breath for 4 days. Initial encounter.  EXAM: PORTABLE CHEST - 1 VIEW  COMPARISON:  [facility] chest radiographs 12/18/2009 report (no images available).  FINDINGS: Portable AP view at 1631 hours. Large body  habitus. Normal cardiac size and mediastinal contours. Visualized tracheal air column is within normal limits. Allowing for portable technique, the lungs are clear. No pneumothorax or pleural effusion identified.  IMPRESSION: No acute cardiopulmonary abnormality.   Electronically Signed   By: Genevie Ann M.D.   On: 04/11/2015 16:48    My interpretation of Electrocardiogram: Sinus tachycardia at 108 bpm. Normal axis. Intervals are normal. No concerning ST changes. T inversion noted in lead 3 and V3. These changes are new compared to EKG from 2015.  Problem List  Principal Problem:   Dyspnea Active Problems:   Hypoxia   Morbid obesity   DM type 2 (diabetes mellitus, type 2)   Acute respiratory failure with hypoxia   Assessment: This is a 34 year old morbidly obese African-American female who comes in with 4-5 day history of shortness of breath mainly with exertion. Chest x-ray was unremarkable. EKG shows nonspecific findings. Troponin is normal. She has multiple risk factors for having thromboembolic disease. Unfortunately, due to her weight she cannot undergo CT scan, but perhaps can undergo VQ scan.  Plan: #1 dyspnea with acute hypoxic respiratory failure: She will be admitted to the hospital. Monitor on telemetry. Due to the high pretest probability for pulmonary embolism, considering her morbid obesity and recent sedentary lifestyle, she will need to undergo further testing. We will place her on IV heparin for now. Due to her weight she cannot undergo CT scan. VQ scan has been ordered. We'll also order an echocardiogram. Repeat troponin. Oxygen.  #2 Sinus tachycardia: Could be due to the above. Check TSH.  #3 history of  diabetes mellitus type 2: Continue with her Levemir and place her on sliding scale coverage. Check HbA1c in the morning.  #4 history of essential hypertension: Monitor blood pressures. Continue altace  #5 Recent left lower extremity cellulitis, mainly involving the left thigh  area. He is to be improving. She still has a few more doses of Bactrim left. We will continue.  DVT Prophylaxis: On full dose heparin for now Code Status: Full code Family Communication: Discussed with the patient  Disposition Plan: Admit to telemetry   Further management decisions will depend on results of further testing and patient's response to treatment.   [facility]  [facility] Pager [phone]  If 7PM-7AM, please contact night-coverage [email or web address] Password [email or web address]  04/11/2015, 10:50 PM

## 2015-04-11 NOTE — ED Notes (Signed)
Pt in US

## 2015-04-11 NOTE — Progress Notes (Signed)
ANTICOAGULATION CONSULT NOTE - Initial Consult  Pharmacy Consult for Heparin Indication:  Rule out PE  Allergies  Allergen Reactions  . Tetanus Toxoids Rash    Patient Measurements: Height: 5\' 11"  (180.3 cm) Weight: (!) 517 lb 3.2 oz (234.6 kg) IBW/kg (Calculated) : 70.8 Heparin Dosing Weight: 134 kg  Vital Signs: Temp: 98.1 F (36.7 C) (06/08 1835) Temp Source: Oral (06/08 1835) BP: 124/81 mmHg (06/08 1835) Pulse Rate: 102 (06/08 1835)  Labs:  Recent Labs  04/11/15 1600  HGB 11.6*  HCT 37.0  PLT 287  CREATININE 1.02*  TROPONINI 0.03    Estimated Creatinine Clearance: 167.2 mL/min (by C-G formula based on Cr of 1.02).   Medical History: Past Medical History  Diagnosis Date  . Diabetes mellitus without complication   . Hypertension   . Obesities, morbid     Assessment: 34 year old female to begin heparin for rule out PE  Goal of Therapy:  Heparin level 0.3-0.7 units/ml Monitor platelets by anticoagulation protocol: Yes   Plan:  Heparin 5000 units iv x 1 bolus Heparin drip at 2700 units / hr Heparin level 6 hours after starts Daily heparin level, CBC  Thank you. Okey RegalLisa Cora Stetson, PharmD [phone]  Elwin SleightPowell, Karlon Schlafer Kay 04/11/2015,7:08 PM

## 2015-04-12 ENCOUNTER — Inpatient Hospital Stay (HOSPITAL_COMMUNITY): Payer: Medicaid Other

## 2015-04-12 DIAGNOSIS — R0600 Dyspnea, unspecified: Secondary | ICD-10-CM

## 2015-04-12 DIAGNOSIS — J9601 Acute respiratory failure with hypoxia: Secondary | ICD-10-CM

## 2015-04-12 DIAGNOSIS — E119 Type 2 diabetes mellitus without complications: Secondary | ICD-10-CM

## 2015-04-12 DIAGNOSIS — R0902 Hypoxemia: Secondary | ICD-10-CM

## 2015-04-12 LAB — CBC
HCT: 33.9 % — ABNORMAL LOW (ref 36.0–46.0)
HEMOGLOBIN: 10.6 g/dL — AB (ref 12.0–15.0)
MCH: 21.4 pg — AB (ref 26.0–34.0)
MCHC: 31.3 g/dL (ref 30.0–36.0)
MCV: 68.3 fL — AB (ref 78.0–100.0)
PLATELETS: 268 10*3/uL (ref 150–400)
RBC: 4.96 MIL/uL (ref 3.87–5.11)
RDW: 16.8 % — ABNORMAL HIGH (ref 11.5–15.5)
WBC: 8.1 10*3/uL (ref 4.0–10.5)

## 2015-04-12 LAB — GLUCOSE, CAPILLARY
GLUCOSE-CAPILLARY: 230 mg/dL — AB (ref 65–99)
Glucose-Capillary: 246 mg/dL — ABNORMAL HIGH (ref 65–99)
Glucose-Capillary: 220 mg/dL — ABNORMAL HIGH (ref 65–99)
Glucose-Capillary: 222 mg/dL — ABNORMAL HIGH (ref 65–99)

## 2015-04-12 LAB — COMPREHENSIVE METABOLIC PANEL
ALK PHOS: 120 U/L (ref 38–126)
ALT: 35 U/L (ref 14–54)
ANION GAP: 8 (ref 5–15)
AST: 31 U/L (ref 15–41)
Albumin: 2.7 g/dL — ABNORMAL LOW (ref 3.5–5.0)
BILIRUBIN TOTAL: 0.5 mg/dL (ref 0.3–1.2)
BUN: 5 mg/dL — ABNORMAL LOW (ref 6–20)
CHLORIDE: 102 mmol/L (ref 101–111)
CO2: 26 mmol/L (ref 22–32)
CREATININE: 0.89 mg/dL (ref 0.44–1.00)
Calcium: 8.2 mg/dL — ABNORMAL LOW (ref 8.9–10.3)
GFR calc Af Amer: >60 mL/min (ref >60)
GLUCOSE: 314 mg/dL — AB (ref 65–99)
POTASSIUM: 3.7 mmol/L (ref 3.5–5.1)
Sodium: 136 mmol/L (ref 135–145)
Total Protein: 6 g/dL — ABNORMAL LOW (ref 6.5–8.1)

## 2015-04-12 LAB — HEPARIN LEVEL (UNFRACTIONATED)
HEPARIN UNFRACTIONATED: 0.49 [IU]/mL (ref 0.30–0.70)
Heparin Unfractionated: 0.71 IU/mL — ABNORMAL HIGH (ref 0.30–0.70)
Heparin Unfractionated: 0.71 IU/mL — ABNORMAL HIGH (ref 0.30–0.70)

## 2015-04-12 LAB — TROPONIN I
TROPONIN I: 0.03 ng/mL (ref <0.031)
Troponin I: 0.03 ng/mL (ref <0.031)

## 2015-04-12 LAB — TSH: TSH: 1.501 u[IU]/mL (ref 0.350–4.500)

## 2015-04-12 MED ORDER — POTASSIUM CHLORIDE CRYS ER 20 MEQ PO TBCR
40.0000 meq | EXTENDED_RELEASE_TABLET | Freq: Once | ORAL | Status: AC
  Administered 2015-04-12: 40 meq via ORAL
  Filled 2015-04-12: qty 2

## 2015-04-12 MED ORDER — TECHNETIUM TO 99M ALBUMIN AGGREGATED
6.0000 | Freq: Once | INTRAVENOUS | Status: AC | PRN
  Administered 2015-04-12: 6 via INTRAVENOUS

## 2015-04-12 MED ORDER — TECHNETIUM TC 99M DIETHYLENETRIAME-PENTAACETIC ACID: 40.0000 | Freq: Once | INTRAVENOUS | Status: AC | PRN

## 2015-04-12 MED ORDER — FUROSEMIDE 10 MG/ML IJ SOLN
40.0000 mg | Freq: Once | INTRAMUSCULAR | Status: AC
  Administered 2015-04-12: 40 mg via INTRAVENOUS
  Filled 2015-04-12: qty 4

## 2015-04-12 NOTE — Progress Notes (Signed)
ANTICOAGULATION CONSULT NOTE - Follow Up Consult  Pharmacy Consult for Heparin Indication: pulmonary embolus  Allergies  Allergen Reactions  . Tetanus Toxoids Itching and Rash    Patient Measurements: Height: 5\' 11"  (180.3 cm) Weight: (!) 517 lb 6.4 oz (234.691 kg) (scale A) IBW/kg (Calculated) : 70.8 Heparin Dosing Weight: 134 kg  Vital Signs: Temp: 98 F (36.7 C) (06/09 1400) Temp Source: Oral (06/09 1400) BP: 111/72 mmHg (06/09 1400) Pulse Rate: 104 (06/09 1400)  Labs:  Recent Labs  04/11/15 1600 04/11/15 2328 04/12/15 0431 04/12/15 1228 04/12/15 1823  HGB 11.6*  --  10.6*  --   --   HCT 37.0  --  33.9*  --   --   PLT 287  --  268  --   --   HEPARINUNFRC  --   --  0.71* 0.49 0.71*  CREATININE 1.02*  --  0.89  --   --   TROPONINI 0.03 0.03 0.03 <0.03  --     Estimated Creatinine Clearance: 191.8 mL/min (by C-G formula based on Cr of 0.89).  Assessment: 34 year old female presents with SOB x several days. 2-3 wks ago she developed pain and swelling in the left leg (was started on abx for cellulitis). Tachycardic with O2 sats 88%.  Anticoagulation: SOB. VQ scan High probability for acute pulmonary embolus.. Hgb 11.6>10.6.   Heparin level slightly above goal this evening on recheck at 0.71. Will reduce rate. No bleeding issues or IV issues noted per nursing.  Goal of Therapy:  Heparin level 0.3-0.7 units/ml Monitor platelets by anticoagulation protocol: Yes   Plan:  Reduce IV heparin at 2450 units/hr Daily Heparin level  Sheppard Coil PharmD., BCPS Clinical Pharmacist Pager [phone] 04/12/2015 8:11 PM

## 2015-04-12 NOTE — Progress Notes (Signed)
[facility] PROGRESS NOTE   Rhonda Roth YDX:[id number] DOB: [birth year] DOA: 04/11/2015 PCP: Silas Flood, MD  HPI/Subjective: Patients feel more comfortable, still short of breath able to complete sentences. Denies any chest pain.  Assessment/Plan: Principal Problem:   Dyspnea Active Problems:   Hypoxia   Morbid obesity   DM type 2 (diabetes mellitus, type 2)   Acute respiratory failure with hypoxia   Acute PE Patient came to the hospital with hypoxia, oxygen saturation of 88%, and chest pain. Elevated d-dimer is, negative lower extremity Dopplers. Patient is morbidly obese cannot fit in CT scan, VQ was done. VQ showed high probability for PE, patient already on heparin. We'll switch to oral anticoagulation.  Acute hypoxic respiratory failure Patient presented with oxygen saturation of 88%, likely secondary to PE.  Diabetes mellitus type 2 Restart home medications, carbohydrate modified diet. Insulin sliding scale.  Morbid obesity Patient counseled extensively about weight loss. Instructed to try harder exercises and watch her weight.  Code Status: Full Code Family Communication: Plan discussed with the patient. Disposition Plan: Remains inpatient Diet: Diet Carb Modified Fluid consistency:: Thin; Room service appropriate?: Yes  Consultants:  None  Procedures:  None  Antibiotics:  None   Objective: Filed Vitals:   04/12/15 1400  BP: 111/72  Pulse: 104  Temp: 98 F (36.7 C)  Resp: 18    Intake/Output Summary (Last 24 hours) at 04/12/15 1503 Last data filed at 04/12/15 1332  Gross per 24 hour  Intake 1421.72 ml  Output    751 ml  Net 670.72 ml   Filed Weights   04/11/15 1805 04/11/15 2046 04/12/15 0452  Weight: 234.6 kg (517 lb 3.2 oz) 234.328 kg (516 lb 9.6 oz) 234.691 kg (517 lb 6.4 oz)    Exam: General: Alert and awake, oriented x3, not in any acute distress. HEENT: anicteric sclera, pupils reactive to light and  accommodation, EOMI CVS: S1-S2 clear, no murmur rubs or gallops Chest: clear to auscultation bilaterally, no wheezing, rales or rhonchi Abdomen: soft nontender, nondistended, normal bowel sounds, no organomegaly Extremities: no cyanosis, clubbing or edema noted bilaterally Neuro: Cranial nerves II-XII intact, no focal neurological deficits  Data Reviewed: Basic Metabolic Panel:  Recent Labs Lab 04/11/15 1600 04/12/15 0431  NA 133* 136  K 3.5 3.7  CL 100* 102  CO2 25 26  GLUCOSE 343* 314*  BUN 11 5*  CREATININE 1.02* 0.89  CALCIUM 8.1* 8.2*   Liver Function Tests:  Recent Labs Lab 04/11/15 1600 04/12/15 0431  AST 34 31  ALT 40 35  ALKPHOS 143* 120  BILITOT 0.2* 0.5  PROT 7.8 6.0*  ALBUMIN 3.4* 2.7*   No results for input(s): LIPASE, AMYLASE in the last 168 hours. No results for input(s): AMMONIA in the last 168 hours. CBC:  Recent Labs Lab 04/11/15 1600 04/12/15 0431  WBC 8.0 8.1  NEUTROABS 4.4  --   HGB 11.6* 10.6*  HCT 37.0 33.9*  MCV 68.6* 68.3*  PLT 287 268   Cardiac Enzymes:  Recent Labs Lab 04/11/15 1600 04/11/15 2328 04/12/15 0431 04/12/15 1228  TROPONINI 0.03 0.03 0.03 <0.03   BNP (last 3 results)  Recent Labs  04/11/15 1600  BNP 140.5*    ProBNP (last 3 results) No results for input(s): PROBNP in the last 8760 hours.  CBG:  Recent Labs Lab 04/11/15 2240 04/12/15 0627 04/12/15 1140  GLUCAP 256* 246* 220*    Micro No results found for this or any previous visit (from the past 240 hour(s)).  Studies: Nm Pulmonary Perf And Vent  04/12/2015   ADDENDUM REPORT: 04/12/2015 13:11  ADDENDUM: Critical Value/emergent results were called by telephone at the time of interpretation on 04/12/2015 at 1:10 pm to Dr. Dr. Arthor Captain, who verbally acknowledged these results.   Electronically Signed   By: Signa Kell M.D.   On: 04/12/2015 13:11   04/12/2015   CLINICAL DATA:  Shortness of breath since Saturday.  EXAM: NUCLEAR MEDICINE VENTILATION -  PERFUSION LUNG SCAN  TECHNIQUE: Ventilation images were obtained in multiple projections using inhaled aerosol Tc-49m DTPA. Perfusion images were obtained in multiple projections after intravenous injection of Tc-41m MAA.  RADIOPHARMACEUTICALS:  6.0 mCi Technetium-77m DTPA aerosol inhalation and 40.0 mCi Technetium-53m MAA IV  COMPARISON:  None.  FINDINGS: Chest radiograph:  No focal lung opacity identified.  Ventilation: No focal ventilation defect.  Perfusion: There are large bilateral segmental perfusion defects identified.  IMPRESSION: 1. High probability for acute pulmonary embolus.  Electronically Signed: By: Signa Kell M.D. On: 04/12/2015 13:03   US Venous Img Lower Bilateral  04/11/2015   CLINICAL DATA:  Shortness of breath for 4 days. LEFT lower extremity pain. Previous skin infection.  EXAM: BILATERAL LOWER EXTREMITY VENOUS DOPPLER ULTRASOUND  TECHNIQUE: Gray-scale sonography with graded compression, as well as color Doppler and duplex ultrasound were performed to evaluate the lower extremity deep venous systems from the level of the common femoral vein and including the common femoral, femoral, profunda femoral, popliteal and calf veins including the posterior tibial, peroneal and gastrocnemius veins when visible. The superficial great saphenous vein was also interrogated. Spectral Doppler was utilized to evaluate flow at rest and with distal augmentation maneuvers in the common femoral, femoral and popliteal veins.  COMPARISON:  None.  FINDINGS: RIGHT LOWER EXTREMITY  Common Femoral Vein: No evidence of thrombus. Normal compressibility, respiratory phasicity and response to augmentation.  Saphenofemoral Junction: No evidence of thrombus. Normal compressibility and flow on color Doppler imaging.  Profunda Femoral Vein: No evidence of thrombus. Normal compressibility and flow on color Doppler imaging.  Femoral Vein: No evidence of thrombus. Normal compressibility, respiratory phasicity and response  to augmentation.  Popliteal Vein: No evidence of thrombus. Normal compressibility, respiratory phasicity and response to augmentation.  Calf Veins: No evidence of thrombus. Normal compressibility and flow on color Doppler imaging.  Superficial Great Saphenous Vein: No evidence of thrombus. Normal compressibility and flow on color Doppler imaging.  Venous Reflux:  None.  Other Findings:  None.  LEFT LOWER EXTREMITY  Common Femoral Vein: No evidence of thrombus. Normal compressibility, respiratory phasicity and response to augmentation.  Saphenofemoral Junction: No evidence of thrombus. Normal compressibility and flow on color Doppler imaging.  Profunda Femoral Vein: No evidence of thrombus. Normal compressibility and flow on color Doppler imaging.  Femoral Vein: No evidence of thrombus. Normal compressibility, respiratory phasicity and response to augmentation.  Popliteal Vein: No evidence of thrombus. Normal compressibility, respiratory phasicity and response to augmentation.  Calf Veins: No evidence of thrombus. Normal compressibility and flow on color Doppler imaging.  Superficial Great Saphenous Vein: Intraluminal thrombus extends from the mid calf to the SFJ.  Venous Reflux:  None.  Other Findings:  None.  IMPRESSION: No evidence for RIGHT or LEFT deep venous thrombosis.  Superficial venous thrombosis involving the superficial great saphenous vein on the LEFT. Intraluminal thrombus extends from the mid calf to the SFJ.   Electronically Signed   By: Davonna Belling M.D.   On: 04/11/2015 20:13   Dg Chest Portable 1 View  04/11/2015   CLINICAL DATA:  34 year old female with shortness of breath for 4 days. Initial encounter.  EXAM: PORTABLE CHEST - 1 VIEW  COMPARISON:  [facility] chest radiographs 12/18/2009 report (no images available).  FINDINGS: Portable AP view at 1631 hours. Large body habitus. Normal cardiac size and mediastinal contours. Visualized tracheal air column is within normal limits.  Allowing for portable technique, the lungs are clear. No pneumothorax or pleural effusion identified.  IMPRESSION: No acute cardiopulmonary abnormality.   Electronically Signed   By: Odessa Fleming M.D.   On: 04/11/2015 16:48    Scheduled Meds: . insulin aspart  0-20 Units Subcutaneous TID WC  . insulin aspart  0-5 Units Subcutaneous QHS  . insulin aspart  6 Units Subcutaneous TID WC  . insulin detemir  50 Units Subcutaneous QHS  . ramipril  10 mg Oral Daily  . sodium chloride  3 mL Intravenous Q12H  . sulfamethoxazole-trimethoprim  1 tablet Oral Q12H   Continuous Infusions: . heparin 2,600 Units/hr (04/12/15 1321)       Time spent: 35 minutes    [facility]  [facility] Pager [phone] If 7PM-7AM, please contact night-coverage at [email or web address], password [facility] 04/12/2015, 3:03 PM  LOS: 1 day

## 2015-04-12 NOTE — Progress Notes (Signed)
  Echocardiogram 2D Echocardiogram has been performed.  Arvil Chaco 04/12/2015, 3:38 PM

## 2015-04-12 NOTE — Progress Notes (Signed)
ANTICOAGULATION CONSULT NOTE - Follow Up Consult  Pharmacy Consult for heparin Indication: r/o PE   Labs:  Recent Labs  04/11/15 1600 04/11/15 2328 04/12/15 0431  HGB 11.6*  --  10.6*  HCT 37.0  --  33.9*  PLT 287  --  268  HEPARINUNFRC  --   --  0.71*  CREATININE 1.02*  --   --   TROPONINI 0.03 0.03  --     Assessment: 33yo female slightly above goal with initial dosing for possible PE, awaiting VQ.  Goal of Therapy:  Heparin level 0.3-0.7 units/ml   Plan:  Will decrease heparin gtt slightly to 2600 units/hr and check level in 6hr.  Vernard Gambles, PharmD, BCPS  04/12/2015,6:13 AM

## 2015-04-12 NOTE — Progress Notes (Signed)
ANTICOAGULATION CONSULT NOTE - Follow Up Consult  Pharmacy Consult for Heparin Indication: pulmonary embolus  Allergies  Allergen Reactions  . Tetanus Toxoids Itching and Rash    Patient Measurements: Height: 5\' 11"  (180.3 cm) Weight: (!) 517 lb 6.4 oz (234.691 kg) (scale A) IBW/kg (Calculated) : 70.8 Heparin Dosing Weight: 134 kg  Vital Signs: Temp: 98.3 F (36.8 C) (06/09 1001) Temp Source: Oral (06/09 0452) BP: 122/68 mmHg (06/09 1001) Pulse Rate: 109 (06/09 1001)  Labs:  Recent Labs  04/11/15 1600 04/11/15 2328 04/12/15 0431 04/12/15 1228  HGB 11.6*  --  10.6*  --   HCT 37.0  --  33.9*  --   PLT 287  --  268  --   HEPARINUNFRC  --   --  0.71* 0.49  CREATININE 1.02*  --  0.89  --   TROPONINI 0.03 0.03 0.03  --     Estimated Creatinine Clearance: 191.8 mL/min (by C-G formula based on Cr of 0.89).  Assessment: 34 year old female presents with SOB x several days. 2-3 wks ago she developed pain and swelling in the left leg (was started on abx for cellulitis). Tachycardic with O2 sats 88%.  Anticoagulation: SOB. VQ scan High probability for acute pulmonary embolus.. Hgb 11.6>10.6. Heparin level 0.49 in goal.    Goal of Therapy:  Heparin level 0.3-0.7 units/ml Monitor platelets by anticoagulation protocol: Yes   Plan:  Continue IV heparin at 2600 units/hr Will reconfirm level in 6 hrs. SHOULD NOT START COUMADIN WHILE ON BACTRIM DUE TO SEVERE DRUG INTERACTION! fyi.  Merilynn Finland, [employer] 04/12/2015,1:28 PM

## 2015-04-13 ENCOUNTER — Encounter (HOSPITAL_COMMUNITY): Payer: Self-pay | Admitting: General Practice

## 2015-04-13 LAB — HEPARIN LEVEL (UNFRACTIONATED): Heparin Unfractionated: 0.38 IU/mL (ref 0.30–0.70)

## 2015-04-13 LAB — CBC
HCT: 34.2 % — ABNORMAL LOW (ref 36.0–46.0)
HEMOGLOBIN: 10.5 g/dL — AB (ref 12.0–15.0)
MCH: 21 pg — ABNORMAL LOW (ref 26.0–34.0)
MCHC: 30.7 g/dL (ref 30.0–36.0)
MCV: 68.5 fL — ABNORMAL LOW (ref 78.0–100.0)
Platelets: 292 10*3/uL (ref 150–400)
RBC: 4.99 MIL/uL (ref 3.87–5.11)
RDW: 16.8 % — AB (ref 11.5–15.5)
WBC: 9.3 10*3/uL (ref 4.0–10.5)

## 2015-04-13 LAB — GLUCOSE, CAPILLARY
GLUCOSE-CAPILLARY: 203 mg/dL — AB (ref 65–99)
Glucose-Capillary: 159 mg/dL — ABNORMAL HIGH (ref 65–99)
Glucose-Capillary: 153 mg/dL — ABNORMAL HIGH (ref 65–99)
Glucose-Capillary: 216 mg/dL — ABNORMAL HIGH (ref 65–99)

## 2015-04-13 LAB — BASIC METABOLIC PANEL
Anion gap: 10 (ref 5–15)
BUN: 5 mg/dL — AB (ref 6–20)
CALCIUM: 8.2 mg/dL — AB (ref 8.9–10.3)
CHLORIDE: 104 mmol/L (ref 101–111)
CO2: 24 mmol/L (ref 22–32)
Creatinine, Ser: 0.9 mg/dL (ref 0.44–1.00)
GFR calc Af Amer: >60 mL/min (ref >60)
GFR calc non Af Amer: >60 mL/min (ref >60)
GLUCOSE: 179 mg/dL — AB (ref 65–99)
POTASSIUM: 3.4 mmol/L — AB (ref 3.5–5.1)
Sodium: 138 mmol/L (ref 135–145)

## 2015-04-13 LAB — HEMOGLOBIN A1C
Hgb A1c MFr Bld: 15.6 % — ABNORMAL HIGH (ref 4.8–5.6)
MEAN PLASMA GLUCOSE: 401 mg/dL

## 2015-04-13 MED ORDER — RIVAROXABAN 15 MG PO TABS
15.0000 mg | ORAL_TABLET | Freq: Two times a day (BID) | ORAL | Status: DC
  Administered 2015-04-13 – 2015-04-14 (×2): 15 mg via ORAL
  Filled 2015-04-13 (×5): qty 1

## 2015-04-13 MED ORDER — PNEUMOCOCCAL VAC POLYVALENT 25 MCG/0.5ML IJ INJ
0.5000 mL | INJECTION | INTRAMUSCULAR | Status: AC
  Administered 2015-04-14: 0.5 mL via INTRAMUSCULAR
  Filled 2015-04-13: qty 0.5

## 2015-04-13 MED ORDER — POTASSIUM CHLORIDE CRYS ER 20 MEQ PO TBCR
40.0000 meq | EXTENDED_RELEASE_TABLET | Freq: Four times a day (QID) | ORAL | Status: AC
  Administered 2015-04-13 (×2): 40 meq via ORAL
  Filled 2015-04-13 (×2): qty 2

## 2015-04-13 MED ORDER — INSULIN DETEMIR 100 UNIT/ML ~~LOC~~ SOLN
60.0000 [IU] | Freq: Every day | SUBCUTANEOUS | Status: DC
  Administered 2015-04-13: 60 [IU] via SUBCUTANEOUS
  Filled 2015-04-13 (×2): qty 0.6

## 2015-04-13 MED ORDER — RIVAROXABAN 20 MG PO TABS
20.0000 mg | ORAL_TABLET | Freq: Every day | ORAL | Status: DC
  Filled 2015-04-13: qty 1

## 2015-04-13 MED ORDER — FUROSEMIDE 10 MG/ML IJ SOLN
40.0000 mg | Freq: Once | INTRAMUSCULAR | Status: AC
  Administered 2015-04-13: 40 mg via INTRAVENOUS
  Filled 2015-04-13: qty 4

## 2015-04-13 MED ORDER — FUROSEMIDE 10 MG/ML IJ SOLN: 40.0000 mg | Freq: Two times a day (BID) | INTRAMUSCULAR | Status: DC

## 2015-04-13 MED ORDER — INSULIN ASPART 100 UNIT/ML ~~LOC~~ SOLN
10.0000 [IU] | Freq: Three times a day (TID) | SUBCUTANEOUS | Status: DC
  Administered 2015-04-14 (×2): 10 [IU] via SUBCUTANEOUS

## 2015-04-13 MED ORDER — RIVAROXABAN 15 MG PO TABS
15.0000 mg | ORAL_TABLET | ORAL | Status: AC
  Administered 2015-04-13: 15 mg via ORAL
  Filled 2015-04-13: qty 1

## 2015-04-13 NOTE — Care Management Note (Signed)
Case Management Note  Patient Details  Name: Rhonda Roth MRN: [id number] Date of Birth: [birth year]  Subjective/Objective:        Admitted with PE            Action/Plan: CM following for DCP; patient has Medicaid / with prescription drug coverage  Expected Discharge Date:       Possibly 04/16/2015           Expected Discharge Plan:  Home/Self Care \ Discharge planning Services  CM Consult     Status of Service:  In process, will continue to follow  Reola Mosher [phone] 04/13/2015, 1:48 PM

## 2015-04-13 NOTE — Progress Notes (Signed)
ANTICOAGULATION CONSULT NOTE - Follow Up Consult  Pharmacy Consult for Heparin >> Xarelto Indication: pulmonary embolus  Allergies  Allergen Reactions  . Tetanus Toxoids Itching and Rash    Patient Measurements: Height: 5\' 11"  (180.3 cm) Weight: (!) 514 lb 9.6 oz (233.421 kg) (scale a) IBW/kg (Calculated) : 70.8 Heparin Dosing Weight: 134 kg  Vital Signs: Temp: 98.4 F (36.9 C) (06/10 0628) Temp Source: Oral (06/10 0628) BP: 121/78 mmHg (06/10 0938) Pulse Rate: 100 (06/10 0628)  Labs:  Recent Labs  04/11/15 1600 04/11/15 2328  04/12/15 0431 04/12/15 1228 04/12/15 1823 04/13/15 0316 04/13/15 0350 04/13/15 0805  HGB 11.6*  --   --  10.6*  --   --  10.5*  --   --   HCT 37.0  --   --  33.9*  --   --  34.2*  --   --   PLT 287  --   --  268  --   --  292  --   --   HEPARINUNFRC  --   --   < > 0.71* 0.49 0.71*  --  0.38  --   CREATININE 1.02*  --   --  0.89  --   --   --   --  0.90  TROPONINI 0.03 0.03  --  0.03 <0.03  --   --   --   --   < > = values in this interval not displayed.  Estimated Creatinine Clearance: 188.8 mL/min (by C-G formula based on Cr of 0.9).  Assessment: 66 YOF presents who presented on 6/8 with SOB x several days. 2-3 wks ago she developed pain and swelling in the left leg (was started on abx for cellulitis). Tachycardic with O2 sats 88%. VQ shows high probability for acute pulmonary embolus. Pharmacy consulted on 6/8 to start heparin for anticoagulation.  Heparin level this morning was therapeutic at 0.38 however plans are to transition the patient to Xarelto this morning. CBC stable, no bleeding noted.   The patient was educated on Xarelto this morning  Goal of Therapy:  Appropriate anticoagulation for indication and renal/hepatic function   Plan:  1. Discontinue heparin this AM 2. At time of heparin discontinuation, start Xarelto 15 mg twice daily with meals for 21 days (thru 6/30) 3. Starting on 7/1, transition to taking Xarelto 20 mg  once daily with supper 4. Will monitor for signs and symptoms of bleeding, renal/hepatic function for any necessary adjustments  Georgina Pillion, PharmD, BCPS Clinical Pharmacist Pager: [phone] 04/13/2015 10:05 AM

## 2015-04-13 NOTE — Clinical Documentation Improvement (Signed)
Potassium results below.  Potassium chloride SA 40 mEq x 2 doses ordered 04/13/2015.  Please identify any clinical conditions associated with the abnormal potassium value and document in your progress note and carry over to the discharge summary.    Component      Potassium  Latest Ref Rng      3.5 - 5.1 mmol/L  04/11/2015     4:00 PM 3.5  04/12/2015     4:31 AM 3.7  04/13/2015     8:05 AM 3.4 (L)   Possible Clinical Conditions: -Hypokalemia -Other condition (please specify) -Unable to determine at present  Thank you, Doy Mince, RN [phone] Clinical Documentation Specialist

## 2015-04-13 NOTE — Progress Notes (Signed)
[facility] PROGRESS NOTE   Rhonda Roth EAV:[id number] DOB: 23-May-1981 DOA: 04/11/2015 PCP: Silas Flood, MD  HPI/Subjective: Some shortness of breath but denies chest pain. Still on heparin drip, discontinue heparin and start on Xarelto, ambulate today.  Assessment/Plan: Principal Problem:   Dyspnea Active Problems:   Hypoxia   Morbid obesity   DM type 2 (diabetes mellitus, type 2)   Acute respiratory failure with hypoxia   Acute PE Patient came to the hospital with hypoxia, oxygen saturation of 88%, and chest pain. Elevated d-dimer is, negative lower extremity Dopplers. Patient is morbidly obese cannot fit in CT scan, VQ was done. VQ showed high probability for PE, patient already on heparin. Started on Xarelto, discontinued IV heparin.  Acute hypoxic respiratory failure Patient presented with oxygen saturation of 88%, likely secondary to PE. This is resolved, currently sats okay in room air  Diabetes mellitus type 2 Restart home medications, carbohydrate modified diet. Hemoglobin A1c is 15.6, this correlates with mean plasma glucose of 401. Patient reported that she was not able to get her medication because of lack of insurance, she is back on Medicaid. We'll increase the Levemir to 60 units at night, add NovoLog with meals.  Morbid obesity Patient counseled extensively about weight loss. Instructed to try water exercises and watch her diet.  Code Status: Full Code Family Communication: Plan discussed with the patient. Disposition Plan: Remains inpatient Diet: Diet Carb Modified Fluid consistency:: Thin; Room service appropriate?: Yes  Consultants:  None  Procedures:  None  Antibiotics:  None   Objective: Filed Vitals:   04/13/15 0938  BP: 121/78  Pulse:   Temp:   Resp:     Intake/Output Summary (Last 24 hours) at 04/13/15 1132 Last data filed at 04/13/15 1009  Gross per 24 hour  Intake 1699.35 ml  Output   2050 ml  Net  -350.65 ml   Filed Weights   04/11/15 2046 04/12/15 0452 04/13/15 0628  Weight: 234.328 kg (516 lb 9.6 oz) 234.691 kg (517 lb 6.4 oz) 233.421 kg (514 lb 9.6 oz)    Exam: General: Alert and awake, oriented x3, not in any acute distress. HEENT: anicteric sclera, pupils reactive to light and accommodation, EOMI CVS: S1-S2 clear, no murmur rubs or gallops Chest: clear to auscultation bilaterally, no wheezing, rales or rhonchi Abdomen: soft nontender, nondistended, normal bowel sounds, no organomegaly Extremities: no cyanosis, clubbing or edema noted bilaterally Neuro: Cranial nerves II-XII intact, no focal neurological deficits  Data Reviewed: Basic Metabolic Panel:  Recent Labs Lab 04/11/15 1600 04/12/15 0431 04/13/15 0805  NA 133* 136 138  K 3.5 3.7 3.4*  CL 100* 102 104  CO2 GLUCOSE 343* 314* 179*  BUN 11 5* 5*  CREATININE 1.02* 0.89 0.90  CALCIUM 8.1* 8.2* 8.2*   Liver Function Tests:  Recent Labs Lab 04/11/15 1600 04/12/15 0431  AST 34 31  ALT 40 35  ALKPHOS 143* 120  BILITOT 0.2* 0.5  PROT 7.8 6.0*  ALBUMIN 3.4* 2.7*   No results for input(s): LIPASE, AMYLASE in the last 168 hours. No results for input(s): AMMONIA in the last 168 hours. CBC:  Recent Labs Lab 04/11/15 1600 04/12/15 0431 04/13/15 0316  WBC 8.0 8.1 9.3  NEUTROABS 4.4  --   --   HGB 11.6* 10.6* 10.5*  HCT 37.0 33.9* 34.2*  MCV 68.6* 68.3* 68.5*  PLT 287 268 292   Cardiac Enzymes:  Recent Labs Lab 04/11/15 1600 04/11/15 2328 04/12/15 0431 04/12/15 1228  TROPONINI 0.03 0.03 0.03 <0.03   BNP (last 3 results)  Recent Labs  04/11/15 1600  BNP 140.5*    ProBNP (last 3 results) No results for input(s): PROBNP in the last 8760 hours.  CBG:  Recent Labs Lab 04/12/15 1140 04/12/15 1636 04/12/15 2149 04/13/15 0625 04/13/15 1116  GLUCAP 220* 222* 230* 159* 153*    Micro No results found for this or any previous visit (from the past 240 hour(s)).    Studies: Nm Pulmonary Perf And Vent  04/12/2015   ADDENDUM REPORT: 04/12/2015 13:11  ADDENDUM: Critical Value/emergent results were called by telephone at the time of interpretation on 04/12/2015 at 1:10 pm to Dr. Dr. Arthor Captain, who verbally acknowledged these results.   Electronically Signed   By: Signa Kell M.D.   On: 04/12/2015 13:11   04/12/2015   CLINICAL DATA:  Shortness of breath since Saturday.  EXAM: NUCLEAR MEDICINE VENTILATION - PERFUSION LUNG SCAN  TECHNIQUE: Ventilation images were obtained in multiple projections using inhaled aerosol Tc-3m DTPA. Perfusion images were obtained in multiple projections after intravenous injection of Tc-17m MAA.  RADIOPHARMACEUTICALS:  6.0 mCi Technetium-41m DTPA aerosol inhalation and 40.0 mCi Technetium-48m MAA IV  COMPARISON:  None.  FINDINGS: Chest radiograph:  No focal lung opacity identified.  Ventilation: No focal ventilation defect.  Perfusion: There are large bilateral segmental perfusion defects identified.  IMPRESSION: 1. High probability for acute pulmonary embolus.  Electronically Signed: By: Signa Kell M.D. On: 04/12/2015 13:03   US Venous Img Lower Bilateral  04/11/2015   CLINICAL DATA:  Shortness of breath for 4 days. LEFT lower extremity pain. Previous skin infection.  EXAM: BILATERAL LOWER EXTREMITY VENOUS DOPPLER ULTRASOUND  TECHNIQUE: Gray-scale sonography with graded compression, as well as color Doppler and duplex ultrasound were performed to evaluate the lower extremity deep venous systems from the level of the common femoral vein and including the common femoral, femoral, profunda femoral, popliteal and calf veins including the posterior tibial, peroneal and gastrocnemius veins when visible. The superficial great saphenous vein was also interrogated. Spectral Doppler was utilized to evaluate flow at rest and with distal augmentation maneuvers in the common femoral, femoral and popliteal veins.  COMPARISON:  None.  FINDINGS: RIGHT LOWER  EXTREMITY  Common Femoral Vein: No evidence of thrombus. Normal compressibility, respiratory phasicity and response to augmentation.  Saphenofemoral Junction: No evidence of thrombus. Normal compressibility and flow on color Doppler imaging.  Profunda Femoral Vein: No evidence of thrombus. Normal compressibility and flow on color Doppler imaging.  Femoral Vein: No evidence of thrombus. Normal compressibility, respiratory phasicity and response to augmentation.  Popliteal Vein: No evidence of thrombus. Normal compressibility, respiratory phasicity and response to augmentation.  Calf Veins: No evidence of thrombus. Normal compressibility and flow on color Doppler imaging.  Superficial Great Saphenous Vein: No evidence of thrombus. Normal compressibility and flow on color Doppler imaging.  Venous Reflux:  None.  Other Findings:  None.  LEFT LOWER EXTREMITY  Common Femoral Vein: No evidence of thrombus. Normal compressibility, respiratory phasicity and response to augmentation.  Saphenofemoral Junction: No evidence of thrombus. Normal compressibility and flow on color Doppler imaging.  Profunda Femoral Vein: No evidence of thrombus. Normal compressibility and flow on color Doppler imaging.  Femoral Vein: No evidence of thrombus. Normal compressibility, respiratory phasicity and response to augmentation.  Popliteal Vein: No evidence of thrombus. Normal compressibility, respiratory phasicity and response to augmentation.  Calf Veins: No evidence of thrombus. Normal compressibility and flow on color Doppler imaging.  Superficial  Great Saphenous Vein: Intraluminal thrombus extends from the mid calf to the SFJ.  Venous Reflux:  None.  Other Findings:  None.  IMPRESSION: No evidence for RIGHT or LEFT deep venous thrombosis.  Superficial venous thrombosis involving the superficial great saphenous vein on the LEFT. Intraluminal thrombus extends from the mid calf to the SFJ.   Electronically Signed   By: Davonna Belling M.D.   On:  04/11/2015 20:13   Dg Chest Portable 1 View  04/11/2015   CLINICAL DATA:  34 year old female with shortness of breath for 4 days. Initial encounter.  EXAM: PORTABLE CHEST - 1 VIEW  COMPARISON:  High [facility] chest radiographs 12/18/2009 report (no images available).  FINDINGS: Portable AP view at 1631 hours. Large body habitus. Normal cardiac size and mediastinal contours. Visualized tracheal air column is within normal limits. Allowing for portable technique, the lungs are clear. No pneumothorax or pleural effusion identified.  IMPRESSION: No acute cardiopulmonary abnormality.   Electronically Signed   By: Odessa Fleming M.D.   On: 04/11/2015 16:48    Scheduled Meds: . furosemide  40 mg Intravenous Once  . insulin aspart  0-20 Units Subcutaneous TID WC  . insulin aspart  0-5 Units Subcutaneous QHS  . insulin aspart  6 Units Subcutaneous TID WC  . insulin detemir  50 Units Subcutaneous QHS  . [START ON 04/14/2015] pneumococcal 23 valent vaccine  0.5 mL Intramuscular Tomorrow-1000  . potassium chloride  40 mEq Oral Q6H  . ramipril  10 mg Oral Daily  . rivaroxaban  15 mg Oral BID WC   Followed by  . [START ON 05/04/2015] rivaroxaban  20 mg Oral Q supper  . sodium chloride  3 mL Intravenous Q12H   Continuous Infusions:       Time spent: 35 minutes    [facility] [address]  [facility] Pager [phone] If 7PM-7AM, please contact night-coverage at [email or web address], password [facility] 04/13/2015, 11:32 AM  LOS: 2 days

## 2015-04-13 NOTE — Discharge Instructions (Signed)
Information on my medicine - XARELTO (rivaroxaban)  This medication education was reviewed with me or my healthcare representative as part of my discharge preparation.  The pharmacist that spoke with me during my hospital stay was:  Rolley Sims, [facility]  WHY WAS Rhonda Roth PRESCRIBED FOR YOU? Xarelto was prescribed to treat blood clots that may have been found in the veins of your legs (deep vein thrombosis) or in your lungs (pulmonary embolism) and to reduce the risk of them occurring again.  What do you need to know about Xarelto? The starting dose is one 15 mg tablet taken TWICE daily with food for the FIRST 21 DAYS then on (enter date)  July 1st, 2016  the dose is changed to one 20 mg tablet taken ONCE A DAY with your evening meal.  DO NOT stop taking Xarelto without talking to the health care provider who prescribed the medication.  Refill your prescription for 20 mg tablets before you run out.  After discharge, you should have regular check-up appointments with your healthcare provider that is prescribing your Xarelto.  In the future your dose may need to be changed if your kidney function changes by a significant amount.  What do you do if you miss a dose? If you are taking Xarelto TWICE DAILY and you miss a dose, take it as soon as you remember. You may take two 15 mg tablets (total 30 mg) at the same time then resume your regularly scheduled 15 mg twice daily the next day.  If you are taking Xarelto ONCE DAILY and you miss a dose, take it as soon as you remember on the same day then continue your regularly scheduled once daily regimen the next day. Do not take two doses of Xarelto at the same time.   Important Safety Information Xarelto is a blood thinner medicine that can cause bleeding. You should call your healthcare provider right away if you experience any of the following: ? Bleeding from an injury or your nose that does not stop. ? Unusual colored urine (red or dark  brown) or unusual colored stools (red or black). ? Unusual bruising for unknown reasons. ? A serious fall or if you hit your head (even if there is no bleeding).  Some medicines may interact with Xarelto and might increase your risk of bleeding while on Xarelto. To help avoid this, consult your healthcare provider or pharmacist prior to using any new prescription or non-prescription medications, including herbals, vitamins, non-steroidal anti-inflammatory drugs (NSAIDs) and supplements.  This website has more information on Xarelto: VisitDestination.com.br.

## 2015-04-14 DIAGNOSIS — E876 Hypokalemia: Secondary | ICD-10-CM | POA: Diagnosis present

## 2015-04-14 DIAGNOSIS — I2699 Other pulmonary embolism without acute cor pulmonale: Principal | ICD-10-CM

## 2015-04-14 LAB — CBC
HEMATOCRIT: 33.7 % — AB (ref 36.0–46.0)
HEMOGLOBIN: 10.6 g/dL — AB (ref 12.0–15.0)
MCH: 21.6 pg — ABNORMAL LOW (ref 26.0–34.0)
MCHC: 31.5 g/dL (ref 30.0–36.0)
MCV: 68.6 fL — ABNORMAL LOW (ref 78.0–100.0)
Platelets: 263 10*3/uL (ref 150–400)
RBC: 4.91 MIL/uL (ref 3.87–5.11)
RDW: 16.9 % — AB (ref 11.5–15.5)
WBC: 8.4 10*3/uL (ref 4.0–10.5)

## 2015-04-14 LAB — GLUCOSE, CAPILLARY
GLUCOSE-CAPILLARY: 185 mg/dL — AB (ref 65–99)
Glucose-Capillary: 209 mg/dL — ABNORMAL HIGH (ref 65–99)

## 2015-04-14 LAB — BASIC METABOLIC PANEL
ANION GAP: 8 (ref 5–15)
BUN: 6 mg/dL (ref 6–20)
CO2: 26 mmol/L (ref 22–32)
CREATININE: 0.94 mg/dL (ref 0.44–1.00)
Calcium: 8.5 mg/dL — ABNORMAL LOW (ref 8.9–10.3)
Chloride: 104 mmol/L (ref 101–111)
GFR calc non Af Amer: >60 mL/min (ref >60)
GLUCOSE: 246 mg/dL — AB (ref 65–99)
Potassium: 4.4 mmol/L (ref 3.5–5.1)
Sodium: 138 mmol/L (ref 135–145)

## 2015-04-14 MED ORDER — RIVAROXABAN (XARELTO) VTE STARTER PACK (15 & 20 MG): ORAL_TABLET | ORAL | Status: AC

## 2015-04-14 MED ORDER — INSULIN ASPART 100 UNIT/ML ~~LOC~~ SOLN: 10.0000 [IU] | Freq: Three times a day (TID) | SUBCUTANEOUS | Status: AC

## 2015-04-14 MED ORDER — INSULIN DETEMIR 100 UNIT/ML FLEXPEN: 60.0000 [IU] | Freq: Every day | SUBCUTANEOUS | Status: AC

## 2015-04-14 NOTE — Discharge Summary (Signed)
Physician Discharge Summary  Rhonda Roth MRN:[id number] DOB: 09/16/1981 DOA: 04/11/2015  PCP: Borun, Alexander Gregory, MD  Admit date: 04/11/2015 Discharge date: 04/14/2015  Time spent: 40 minutes  Recommendations for Outpatient Follow-up:  1. Follow-up with primary care physician within one week. 2. Stop the Depo-Provera injections  Discharge Diagnoses:  Principal Problem:   Acute pulmonary embolism Active Problems:   Hypoxia   Morbid obesity   DM type 2 (diabetes mellitus, type 2)   Acute respiratory failure with hypoxia   Hypokalemia   Discharge Condition: Stable  Diet recommendation: Heart healthy  Filed Weights   04/12/15 0452 04/13/15 0628 04/14/15 0459  Weight: 234.691 kg (517 lb 6.4 oz) 233.421 kg (514 lb 9.6 oz) 234.056 kg (516 lb)    History of present illness:  Rhonda Roth is a 33 y.o. female with a past medical history of for diabetes on insulin, morbid obesity, hypertension, who was in her usual state of health about 2-3 weeks ago when she started developing pain and swelling in the left leg, especially in the thigh area. She was noted to have symptoms suggestive of cellulitis. She was started on antibiotics. Initial antibiotics did not work so she was changed over to Bactrim. She still has about 2-3 days of antibiotics left. As a result of the cellulitis patient has had decreased mobility over the last of 10 days or so. On Saturday she wanted to get up and move around. She felt short of breath. This was mainly with exertion. She decided to wait out her symptoms. But the symptoms did not get any better. So she called her primary care physician's and was made an appointment for today. She was sent over to the emergency department. Over in the emergency department she was found to be tachycardic to 110s to 120s. Oxygen saturation was 88% on room air. Subsequently, we were called for hospitalization. Patient denies any chest pain, dizziness, nausea, vomiting. She's never  had similar symptoms before. She denies any history of lung disease, heart disease. Denies any fever, chills. She did have one episode of cough which was dry. Did have some palpitations.  Hospital Course:   Acute PE Patient came to the hospital with hypoxia, oxygen saturation of 88%, and chest pain. Elevated d-dimer is, negative lower extremity Dopplers. Patient is morbidly obese cannot fit in CT scan, VQ was done. VQ showed high probability for PE, risk factors include morbid obesity and Depo-Provera injections Was on heparin drip, discontinued, patient discharged on Xarelto.  Acute hypoxic respiratory failure Patient presented with oxygen saturation of 88%, likely secondary to PE. This is resolved, currently sats okay in room air  Diabetes mellitus type 2 Restart home medications, carbohydrate modified diet. Hemoglobin A1c is 15.6, this correlates with mean plasma glucose of 401. Patient reported that she was not able to get her medication because of lack of insurance, she is back on Medicaid. We'll increase the Levemir to 60 units at night, add NovoLog with meals.  Morbid obesity Patient counseled extensively about weight loss. Instructed to try water exercises and watch her diet.  Hypokalemia Mild hypokalemia with potassium of 3.4, likely secondary to diuresis. Repleted with oral supplements.  Procedures:  None  Consultations:  None  Discharge Exam: Filed Vitals:   04/14/15 0459  BP: 137/61  Pulse: 101  Temp: 98.2 F (36.8 C)  Resp: 18   General: Alert and awake, oriented x3, not in any acute distress. HEENT: anicteric sclera, pupils reactive to light and accommodation, EOMI CVS: S1-S2   clear, no murmur rubs or gallops Chest: clear to auscultation bilaterally, no wheezing, rales or rhonchi Abdomen: soft nontender, nondistended, normal bowel sounds, no organomegaly Extremities: no cyanosis, clubbing or edema noted bilaterally Neuro: Cranial nerves II-XII intact,  no focal neurological deficits  Discharge Instructions   Discharge Instructions    Diet - low sodium heart healthy    Complete by:  As directed      Increase activity slowly    Complete by:  As directed           Current Discharge Medication List    START taking these medications   Details  Rivaroxaban (XARELTO STARTER PACK) 15 & 20 MG TBPK Take as directed on package: Start with one 15mg tablet by mouth twice a day with food. On Day 22, switch to one 20mg tablet once a day with food. Qty: 51 each, Refills: 0      CONTINUE these medications which have CHANGED   Details  insulin aspart (NOVOLOG) 100 UNIT/ML injection Inject 10 Units into the skin 3 (three) times daily before meals. Sliding scale. Qty: 10 mL, Refills: 11    insulin detemir (LEVEMIR) 100 unit/ml SOLN Inject 0.6 mLs (60 Units total) into the skin at bedtime.      CONTINUE these medications which have NOT CHANGED   Details  Blood Pressure Monitoring (ADULT BLOOD PRESSURE CUFF LG) KIT Check your blood pressure daily. Call the clinic if 140/90.    Insulin Syringe-Needle U-100 (INSULIN SYRINGE 1CC/30GX5/16") 30G X 5/16" 1 ML MISC Use to inject insulin 4 times a day.    ramipril (ALTACE) 10 MG capsule Take 10 mg by mouth daily.    ReliOn Ultra Thin Lancets MISC Use to check glucose up to 4 times a day.  Note:dx: 250.01    triamterene-hydrochlorothiazide (DYAZIDE) 37.5-25 MG per capsule Take 1 capsule by mouth daily.      STOP taking these medications     medroxyPROGESTERone (DEPO-PROVERA) 150 MG/ML injection        Allergies  Allergen Reactions  . Tetanus Toxoids Itching and Rash      The results of significant diagnostics from this hospitalization (including imaging, microbiology, ancillary and laboratory) are listed below for reference.    Significant Diagnostic Studies: Nm Pulmonary Perf And Vent  04/12/2015   ADDENDUM REPORT: 04/12/2015 13:11  ADDENDUM: Critical Value/emergent results were called  by telephone at the time of interpretation on 04/12/2015 at 1:10 pm to Dr. Dr. Elmahi, who verbally acknowledged these results.   Electronically Signed   By: Taylor  Stroud M.D.   On: 04/12/2015 13:11   04/12/2015   CLINICAL DATA:  Shortness of breath since Saturday.  EXAM: NUCLEAR MEDICINE VENTILATION - PERFUSION LUNG SCAN  TECHNIQUE: Ventilation images were obtained in multiple projections using inhaled aerosol Tc-99m DTPA. Perfusion images were obtained in multiple projections after intravenous injection of Tc-99m MAA.  RADIOPHARMACEUTICALS:  6.0 mCi Technetium-99m DTPA aerosol inhalation and 40.0 mCi Technetium-99m MAA IV  COMPARISON:  None.  FINDINGS: Chest radiograph:  No focal lung opacity identified.  Ventilation: No focal ventilation defect.  Perfusion: There are large bilateral segmental perfusion defects identified.  IMPRESSION: 1. High probability for acute pulmonary embolus.  Electronically Signed: By: Taylor  Stroud M.D. On: 04/12/2015 13:03   Us Venous Img Lower Bilateral  04/11/2015   CLINICAL DATA:  Shortness of breath for 4 days. LEFT lower extremity pain. Previous skin infection.  EXAM: BILATERAL LOWER EXTREMITY VENOUS DOPPLER ULTRASOUND  TECHNIQUE: Gray-scale sonography with graded compression,   as well as color Doppler and duplex ultrasound were performed to evaluate the lower extremity deep venous systems from the level of the common femoral vein and including the common femoral, femoral, profunda femoral, popliteal and calf veins including the posterior tibial, peroneal and gastrocnemius veins when visible. The superficial great saphenous vein was also interrogated. Spectral Doppler was utilized to evaluate flow at rest and with distal augmentation maneuvers in the common femoral, femoral and popliteal veins.  COMPARISON:  None.  FINDINGS: RIGHT LOWER EXTREMITY  Common Femoral Vein: No evidence of thrombus. Normal compressibility, respiratory phasicity and response to augmentation.   Saphenofemoral Junction: No evidence of thrombus. Normal compressibility and flow on color Doppler imaging.  Profunda Femoral Vein: No evidence of thrombus. Normal compressibility and flow on color Doppler imaging.  Femoral Vein: No evidence of thrombus. Normal compressibility, respiratory phasicity and response to augmentation.  Popliteal Vein: No evidence of thrombus. Normal compressibility, respiratory phasicity and response to augmentation.  Calf Veins: No evidence of thrombus. Normal compressibility and flow on color Doppler imaging.  Superficial Great Saphenous Vein: No evidence of thrombus. Normal compressibility and flow on color Doppler imaging.  Venous Reflux:  None.  Other Findings:  None.  LEFT LOWER EXTREMITY  Common Femoral Vein: No evidence of thrombus. Normal compressibility, respiratory phasicity and response to augmentation.  Saphenofemoral Junction: No evidence of thrombus. Normal compressibility and flow on color Doppler imaging.  Profunda Femoral Vein: No evidence of thrombus. Normal compressibility and flow on color Doppler imaging.  Femoral Vein: No evidence of thrombus. Normal compressibility, respiratory phasicity and response to augmentation.  Popliteal Vein: No evidence of thrombus. Normal compressibility, respiratory phasicity and response to augmentation.  Calf Veins: No evidence of thrombus. Normal compressibility and flow on color Doppler imaging.  Superficial Great Saphenous Vein: Intraluminal thrombus extends from the mid calf to the SFJ.  Venous Reflux:  None.  Other Findings:  None.  IMPRESSION: No evidence for RIGHT or LEFT deep venous thrombosis.  Superficial venous thrombosis involving the superficial great saphenous vein on the LEFT. Intraluminal thrombus extends from the mid calf to the SFJ.   Electronically Signed   By: Rolla Flatten M.D.   On: 04/11/2015 20:13   Dg Chest Portable 1 View  04/11/2015   CLINICAL DATA:  34 year old female with shortness of breath for 4 days.  Initial encounter.  EXAM: PORTABLE CHEST - 1 VIEW  COMPARISON:  [facility]ographs 12/18/2009 report (no images available).  FINDINGS: Portable AP view at 1631 hours. Large body habitus. Normal cardiac size and mediastinal contours. Visualized tracheal air column is within normal limits. Allowing for portable technique, the lungs are clear. No pneumothorax or pleural effusion identified.  IMPRESSION: No acute cardiopulmonary abnormality.   Electronically Signed   By: Genevie Ann M.D.   On: 04/11/2015 16:48    Microbiology: No results found for this or any previous visit (from the past 240 hour(s)).   Labs: Basic Metabolic Panel:  Recent Labs Lab 04/11/15 1600 04/12/15 0431 04/13/15 0805 04/14/15 0239  NA 133* 136 138 138  K 3.5 3.7 3.4* 4.4  CL 100* 102 104 104  CO2 _0 GLUCOSE 343* 314* 179* 246*  BUN 11 5* 5* 6  CREATININE 1.02* 0.89 0.90 0.94  CALCIUM 8.1* 8.2* 8.2* 8.5*   Liver Function Tests:  Recent Labs Lab 04/11/15 1600 04/12/15 0431  AST 34 31  ALT 40 35  ALKPHOS 143* 120  BILITOT 0.2* 0.5  PROT 7.8 6.0*  ALBUMIN 3.4* 2.7*   No results for input(s): LIPASE, AMYLASE in the last 168 hours. No results for input(s): AMMONIA in the last 168 hours. CBC:  Recent Labs Lab 04/11/15 1600 04/12/15 0431 04/13/15 0316 04/14/15 0239  WBC 8.0 8.1 9.3 8.4  NEUTROABS 4.4  --   --   --   HGB 11.6* 10.6* 10.5* 10.6*  HCT 37.0 33.9* 34.2* 33.7*  MCV 68.6* 68.3* 68.5* 68.6*  PLT 287 268 292 263   Cardiac Enzymes:  Recent Labs Lab 04/11/15 1600 04/11/15 2328 04/12/15 0431 04/12/15 1228  TROPONINI 0.03 0.03 0.03 <0.03   BNP: BNP (last 3 results)  Recent Labs  04/11/15 1600  BNP 140.5*    ProBNP (last 3 results) No results for input(s): PROBNP in the last 8760 hours.  CBG:  Recent Labs Lab 04/13/15 0625 04/13/15 1116 04/13/15 1629 04/13/15 2119 04/14/15 0612  GLUCAP 159* 153* 216* 203* 209*        Signed:  Brannon Decaire A  [facility] 04/14/2015, 11:09 AM

## 2015-04-15 NOTE — Care Management Note (Signed)
Case Management Note  Patient Details  Name: Guinevere Stephenson MRN: [id number] Date of Birth: 1981-06-17  Subjective/Objective:                   Chief Complaint: Shortness of breath since Saturday Action/Plan:   Expected Discharge Date:  04/14/15               Expected Discharge Plan:  Home/Self Care  In-House Referral:     Discharge planning Services  CM Consult, Medication Assistance  Post Acute Care Choice:    Choice offered to:     DME Arranged:    DME Agency:     HH Arranged:    [place] Agency:     Status of Service:  Completed, signed off  Medicare Important Message Given:    Date Medicare IM Given:    Medicare IM give by:    Date Additional Medicare IM Given:    Additional Medicare Important Message give by:     If discussed at [place] of Stay Meetings, dates discussed:    Additional Comments: Late entry: CM met with pt and gave her a free 30 day trial card for Xarelto.  Pt verbalized understanding this will give her follow up physician enough time to have the medication authorized by insurance to cover the refills.  No other CM needs were communicated. Dellie Catholic, RN 04/15/2015, 4:16 PM

## 2016-06-26 ENCOUNTER — Emergency Department (HOSPITAL_BASED_OUTPATIENT_CLINIC_OR_DEPARTMENT_OTHER)
Admission: EM | Admit: 2016-06-26 | Discharge: 2016-06-27 | Disposition: A | Discharge status: Home / Self Care | Payer: Medicaid Other | Attending: Emergency Medicine | Admitting: Emergency Medicine

## 2016-06-26 ENCOUNTER — Encounter (HOSPITAL_BASED_OUTPATIENT_CLINIC_OR_DEPARTMENT_OTHER): Payer: Self-pay | Admitting: *Deleted

## 2016-06-26 DIAGNOSIS — E119 Type 2 diabetes mellitus without complications: Secondary | ICD-10-CM | POA: Diagnosis not present

## 2016-06-26 DIAGNOSIS — Z794 Long term (current) use of insulin: Secondary | ICD-10-CM | POA: Insufficient documentation

## 2016-06-26 DIAGNOSIS — L500 Allergic urticaria: Secondary | ICD-10-CM | POA: Insufficient documentation

## 2016-06-26 DIAGNOSIS — R21 Rash and other nonspecific skin eruption: Secondary | ICD-10-CM | POA: Diagnosis present

## 2016-06-26 DIAGNOSIS — Z79899 Other long term (current) drug therapy: Secondary | ICD-10-CM | POA: Insufficient documentation

## 2016-06-26 DIAGNOSIS — I10 Essential (primary) hypertension: Secondary | ICD-10-CM | POA: Diagnosis not present

## 2016-06-26 DIAGNOSIS — L509 Urticaria, unspecified: Secondary | ICD-10-CM

## 2016-06-26 MED ORDER — DIPHENHYDRAMINE HCL 50 MG/ML IJ SOLN
25.0000 mg | Freq: Once | INTRAMUSCULAR | Status: DC
  Filled 2016-06-26: qty 1

## 2016-06-26 MED ORDER — FAMOTIDINE 20 MG PO TABS
20.0000 mg | ORAL_TABLET | Freq: Once | ORAL | Status: AC
  Administered 2016-06-27: 20 mg via ORAL
  Filled 2016-06-26: qty 1

## 2016-06-26 MED ORDER — PREDNISONE 10 MG PO TABS
60.0000 mg | ORAL_TABLET | Freq: Once | ORAL | Status: AC
  Administered 2016-06-27: 60 mg via ORAL
  Filled 2016-06-26: qty 1

## 2016-06-26 NOTE — ED Provider Notes (Signed)
[facility] DEPT [facility] Provider Note   CSN: [id number] Arrival date & time: 06/26/16  2308  By signing my name below, I, Rhonda Roth, attest that this documentation has been prepared under the direction and in the presence of Rhonda Rosser, MD . Electronically Signed: Estanislado Roth, Scribe. 06/27/2016. 12:39 AM.    History   Chief Complaint Chief Complaint  Patient presents with  . Rash    HPI Rhonda Roth is a 35 y.o. female.  The history is provided by the patient. No language interpreter was used.  HPI Comments:  Rhonda Roth is a 35 y.o. female with PMHx of DM and HTN who presents to the Emergency Department complaining of sudden onset, constant itchy rash x 18 hours. Pt first noticed the rash on her buttocks x 4 days, used rubbing alcohol topically for relief and the rash went away. Pt states that rash reappeared this morning on the back of her neck, left arm, back, abdomen. Pt mentioned that 3 weeks ago she cut her grass and became itchy and a small rash appeared but went away quickly. Pt also notes that her mother used a new laundry detergent this week and washed some of her clothes. Pt has not taken any oral medications to relieve the rash but has used [employer] ointment. Pt denies using new soaps, OTC medications, or foods. Pt denies fever, sensation of throat closing, difficulty breathing, swelling of mouth/tongue/throat, CP, abdominal pain, vomiting.    Past Medical History:  Diagnosis Date  . Diabetes mellitus without complication ([place])   . Hypertension   . Hypoxia 04/2015  . Obesities, morbid [facility])     Patient Active Problem List   Diagnosis Date Noted  . Hypokalemia 04/14/2015  . Hypoxia 04/11/2015  . Acute pulmonary embolism ([place]) 04/11/2015  . Morbid obesity ([place]) 04/11/2015  . DM type 2 (diabetes mellitus, type 2) ([place]) 04/11/2015  . Acute respiratory failure with hypoxia ([place]) 04/11/2015    Past Surgical History:  Procedure Laterality Date  .  OOPHORECTOMY      OB History    No data available       Home Medications    Prior to Admission medications   Medication Sig Start Date End Date Taking? Authorizing Provider  Blood Pressure Monitoring (ADULT BLOOD PRESSURE CUFF LG) KIT Check your blood pressure daily. Call the clinic if 140/90. 12/16/13   Historical Provider, MD  diphenhydrAMINE (BENADRYL) 25 mg capsule Take 1 capsule (25 mg total) by mouth every 6 (six) hours as needed. 06/27/16   Nona Dell, PA-C  famotidine (PEPCID) 20 MG tablet Take 1 tablet (20 mg total) by mouth 2 (two) times daily. 06/27/16   Nona Dell, PA-C  insulin aspart (NOVOLOG) 100 UNIT/ML injection Inject 10 Units into the skin 3 (three) times daily before meals. Sliding scale. 04/14/15   Verlee Monte, MD  insulin detemir (LEVEMIR) 100 unit/ml SOLN Inject 0.6 mLs (60 Units total) into the skin at bedtime. 04/14/15   Verlee Monte, MD  Insulin Syringe-Needle U-100 (INSULIN SYRINGE 1CC/30GX5/16") 30G X 5/16" 1 ML MISC Use to inject insulin 4 times a day. 04/04/15   Historical Provider, MD  ramipril (ALTACE) 10 MG capsule Take 10 mg by mouth daily.    Historical Provider, MD  ReliOn Ultra Thin Lancets MISC Use to check glucose up to 4 times a day.  Note:dx: 250.01 04/04/15   Historical Provider, MD  Rivaroxaban (XARELTO STARTER PACK) 15 & 20 MG TBPK Take as directed on package: Start  with one 83m tablet by mouth twice a day with food. On Day 22, switch to one 21mtablet once a day with food. 04/14/15   MuVerlee MonteMD  triamterene-hydrochlorothiazide (DYAZIDE) 37.5-25 MG per capsule Take 1 capsule by mouth daily.    Historical Provider, MD    Family History No family history on file.  Social History Social History  Substance Use Topics  . Smoking status: Never Smoker  . Smokeless tobacco: Never Used  . Alcohol use No     Allergies   Tetanus toxoids   Review of Systems Review of Systems  Constitutional: Negative for fever.    Skin: Positive for rash.     Physical Exam Updated Vital Signs BP 156/100 (BP Location: Left Arm) Comment: Pt. reports she has been with out her B/P meds for 2 weeks and will pick it up in the morning  Pulse 76   Temp 98 F (36.7 C) (Oral)   Resp 17   Ht _0  (1.753 m)   Wt (!) 204.1 kg   LMP 06/26/2016   SpO2 100%   BMI 66.45 kg/m   Physical Exam  Constitutional: She is oriented to person, place, and time. She appears well-developed and well-nourished. No distress.  HENT:  Head: Normocephalic and atraumatic.  Mouth/Throat: Uvula is midline, oropharynx is clear and moist and mucous membranes are normal. No oral lesions. No uvula swelling. No oropharyngeal exudate, posterior oropharyngeal edema, posterior oropharyngeal erythema or tonsillar abscesses. No tonsillar exudate.  Eyes: Conjunctivae and EOM are normal. Right eye exhibits no discharge. Left eye exhibits no discharge. No scleral icterus.  Neck: Normal range of motion. Neck supple.  Cardiovascular: Normal rate, regular rhythm, normal heart sounds and intact distal pulses.   No murmur heard. Pulmonary/Chest: Effort normal and breath sounds normal. No respiratory distress. She has no wheezes. She has no rales. She exhibits no tenderness.  Abdominal: Soft. Bowel sounds are normal. She exhibits no distension and no mass. There is no tenderness. There is no rebound and no guarding. No hernia.  Musculoskeletal: She exhibits no edema.  Neurological: She is alert and oriented to person, place, and time.  Skin: Skin is warm and dry.  Multiple scattered raised erythematous hives noted to BUE, back and abdomen. No vesicles, papules, pustules, bulla, or drainage noted. No lesions on palms or soles.   Psychiatric: She has a normal mood and affect.  Nursing note and vitals reviewed.    ED Treatments / Results  DIAGNOSTIC STUDIES:  Oxygen Saturation is 100% on RA, normal by my interpretation.    COORDINATION OF CARE:  12:39 AM  Discussed treatment plan with pt at bedside and pt agreed to plan.  Labs (all labs ordered are listed, but only abnormal results are displayed) Labs Reviewed - No data to display  EKG  EKG Interpretation None       Radiology No results found.  Procedures Procedures (including critical care time)  Medications Ordered in ED Medications  famotidine (PEPCID) tablet 20 mg (20 mg Oral Given 06/27/16 0008)  predniSONE (DELTASONE) tablet 60 mg (60 mg Oral Given 06/27/16 0007)  diphenhydrAMINE (BENADRYL) injection 50 mg (50 mg Intramuscular Given 06/27/16 0008)     Initial Impression / Assessment and Plan / ED Course  I have reviewed the triage vital signs and the nursing notes.  Pertinent labs & imaging results that were available during my care of the patient were reviewed by me and considered in my medical decision making (see chart for  details).  Clinical Course   Rash consistent with urticaria. Patient denies any difficulty breathing or swallowing.  Pt has a patent airway without stridor and is handling secretions without difficulty; no angioedema. No blisters, no pustules, no warmth, no draining sinus tracts, no superficial abscesses, no bullous impetigo, no vesicles, no desquamation, no target lesions with dusky purpura or a central bulla. Not tender to touch. No concern for superimposed infection. No concern for SJS, TEN, TSS, tick borne illness, syphilis or other life-threatening condition. Pt given benadryl, pepcid and prednisone in the ED. On reevaluation Patient reports her pruritus has significantly improved. Urticarial lesions have decreased in size on reexamination. Will discharge home with pepcid and recommend Benadryl as needed for pruritis. Advised patient to refrain from using her current detergent. Advised patient to follow up with PCP as needed. Discussed return precautions with patient.    Final Clinical Impressions(s) / ED Diagnoses   Final diagnoses:  Hives     New Prescriptions New Prescriptions   DIPHENHYDRAMINE (BENADRYL) 25 MG CAPSULE    Take 1 capsule (25 mg total) by mouth every 6 (six) hours as needed.   FAMOTIDINE (PEPCID) 20 MG TABLET    Take 1 tablet (20 mg total) by mouth 2 (two) times daily.   I personally performed the services described in this documentation, which was scribed in my presence. The recorded information has been reviewed and is accurate.     Chesley Noon [place], [place] 06/27/16 [place], MD 06/27/16 [id number]

## 2016-06-27 MED ORDER — FAMOTIDINE 20 MG PO TABS: 20.0000 mg | ORAL_TABLET | Freq: Two times a day (BID) | ORAL | 0 refills | Status: AC

## 2016-06-27 MED ORDER — DIPHENHYDRAMINE HCL 50 MG/ML IJ SOLN
50.0000 mg | Freq: Once | INTRAMUSCULAR | Status: AC
  Administered 2016-06-27: 50 mg via INTRAMUSCULAR

## 2016-06-27 MED ORDER — DIPHENHYDRAMINE HCL 25 MG PO CAPS: 25.0000 mg | ORAL_CAPSULE | Freq: Four times a day (QID) | ORAL | 0 refills | Status: AC | PRN

## 2016-06-27 NOTE — Discharge Instructions (Signed)
Take your medications as prescribed as needed for itching. I recommend refraining from using your current new detergent. Follow-up with your primary care provider within the next week as needed. Please return to the Emergency Department if symptoms worsen or new onset of fever, abdominal pain, new/worsening rash, sensation of throat closing, difficulty breathing.

## 2016-12-10 ENCOUNTER — Encounter (HOSPITAL_BASED_OUTPATIENT_CLINIC_OR_DEPARTMENT_OTHER): Payer: Self-pay

## 2016-12-10 DIAGNOSIS — Z794 Long term (current) use of insulin: Secondary | ICD-10-CM | POA: Insufficient documentation

## 2016-12-10 DIAGNOSIS — Z79899 Other long term (current) drug therapy: Secondary | ICD-10-CM | POA: Insufficient documentation

## 2016-12-10 DIAGNOSIS — M62830 Muscle spasm of back: Secondary | ICD-10-CM | POA: Diagnosis not present

## 2016-12-10 DIAGNOSIS — E119 Type 2 diabetes mellitus without complications: Secondary | ICD-10-CM | POA: Diagnosis not present

## 2016-12-10 DIAGNOSIS — I10 Essential (primary) hypertension: Secondary | ICD-10-CM | POA: Insufficient documentation

## 2016-12-10 DIAGNOSIS — R51 Headache: Secondary | ICD-10-CM | POA: Diagnosis present

## 2016-12-10 NOTE — ED Triage Notes (Signed)
Pt c/o recurrent headache for the last two weeks that has been consistent for the last two days.  Pt c/o nausea with the headache, it was unrelieved with one dose of excedrin tonight

## 2016-12-11 ENCOUNTER — Encounter (HOSPITAL_BASED_OUTPATIENT_CLINIC_OR_DEPARTMENT_OTHER): Payer: Self-pay | Admitting: Emergency Medicine

## 2016-12-11 ENCOUNTER — Emergency Department (HOSPITAL_BASED_OUTPATIENT_CLINIC_OR_DEPARTMENT_OTHER)
Admission: EM | Admit: 2016-12-11 | Discharge: 2016-12-11 | Disposition: A | Discharge status: Home / Self Care | Payer: Medicaid Other | Attending: Emergency Medicine | Admitting: Emergency Medicine

## 2016-12-11 ENCOUNTER — Emergency Department (HOSPITAL_BASED_OUTPATIENT_CLINIC_OR_DEPARTMENT_OTHER): Payer: Medicaid Other

## 2016-12-11 DIAGNOSIS — M62838 Other muscle spasm: Secondary | ICD-10-CM

## 2016-12-11 LAB — PREGNANCY, URINE: Preg Test, Ur: NEGATIVE

## 2016-12-11 MED ORDER — IBUPROFEN 800 MG PO TABS
800.0000 mg | ORAL_TABLET | Freq: Once | ORAL | Status: AC
  Administered 2016-12-11: 800 mg via ORAL
  Filled 2016-12-11: qty 1

## 2016-12-11 MED ORDER — METHOCARBAMOL 500 MG PO TABS
1000.0000 mg | ORAL_TABLET | Freq: Once | ORAL | Status: AC
  Administered 2016-12-11: 1000 mg via ORAL
  Filled 2016-12-11: qty 2

## 2016-12-11 MED ORDER — NAPROXEN 375 MG PO TABS: 375.0000 mg | ORAL_TABLET | Freq: Two times a day (BID) | ORAL | 0 refills | Status: AC

## 2016-12-11 MED ORDER — METHOCARBAMOL 500 MG PO TABS: 500.0000 mg | ORAL_TABLET | Freq: Two times a day (BID) | ORAL | 0 refills | Status: AC

## 2016-12-11 MED ORDER — METOCLOPRAMIDE HCL 10 MG PO TABS
10.0000 mg | ORAL_TABLET | Freq: Once | ORAL | Status: AC
  Administered 2016-12-11: 10 mg via ORAL
  Filled 2016-12-11: qty 1

## 2016-12-11 NOTE — ED Provider Notes (Signed)
[facility] Provider Note   CSN: [id number] Arrival date & time: 12/10/16  2333     History   Chief Complaint Chief Complaint  Patient presents with  . Headache    HPI Rhonda Roth is a 36 y.o. female.  The history is provided by the patient.  Headache   This is a chronic problem. The current episode started more than 1 week ago (3 weeks). The problem occurs constantly. The problem has not changed since onset.The headache is associated with nothing (laying on the that side moving the head). The pain is located in the right unilateral region. The pain is moderate. The pain does not radiate. Pertinent negatives include no anorexia, no fever, no malaise/fatigue, no chest pressure, no near-syncope, no orthopnea, no palpitations, no syncope, no shortness of breath, no nausea and no vomiting. Treatments tried: excedrin. The treatment provided moderate relief.    Past Medical History:  Diagnosis Date  . Diabetes mellitus without complication ([place])   . Hypertension   . Hypoxia 04/2015  . Obesities, morbid [facility])     Patient Active Problem List   Diagnosis Date Noted  . Hypokalemia 04/14/2015  . Hypoxia 04/11/2015  . Acute pulmonary embolism ([place]) 04/11/2015  . Morbid obesity ([place]) 04/11/2015  . DM type 2 (diabetes mellitus, type 2) ([place]) 04/11/2015  . Acute respiratory failure with hypoxia ([place]) 04/11/2015    Past Surgical History:  Procedure Laterality Date  . OOPHORECTOMY      OB History    No data available       Home Medications    Prior to Admission medications   Medication Sig Start Date End Date Taking? Authorizing Provider  Blood Pressure Monitoring (ADULT BLOOD PRESSURE CUFF LG) KIT Check your blood pressure daily. Call the clinic if 140/90. 12/16/13   Historical Provider, MD  diphenhydrAMINE (BENADRYL) 25 mg capsule Take 1 capsule (25 mg total) by mouth every 6 (six) hours as needed. 06/27/16   Nona Dell, PA-C  famotidine (PEPCID) 20  MG tablet Take 1 tablet (20 mg total) by mouth 2 (two) times daily. 06/27/16   Nona Dell, PA-C  insulin aspart (NOVOLOG) 100 UNIT/ML injection Inject 10 Units into the skin 3 (three) times daily before meals. Sliding scale. 04/14/15   Verlee Monte, MD  insulin detemir (LEVEMIR) 100 unit/ml SOLN Inject 0.6 mLs (60 Units total) into the skin at bedtime. 04/14/15   Verlee Monte, MD  Insulin Syringe-Needle U-100 (INSULIN SYRINGE 1CC/30GX5/16") 30G X 5/16" 1 ML MISC Use to inject insulin 4 times a day. 04/04/15   Historical Provider, MD  ramipril (ALTACE) 10 MG capsule Take 10 mg by mouth daily.    Historical Provider, MD  ReliOn Ultra Thin Lancets MISC Use to check glucose up to 4 times a day.  Note:dx: 250.01 04/04/15   Historical Provider, MD  Rivaroxaban (XARELTO STARTER PACK) 15 & 20 MG TBPK Take as directed on package: Start with one '15mg'$  tablet by mouth twice a day with food. On Day 22, switch to one '20mg'$  tablet once a day with food. 04/14/15   Verlee Monte, MD  triamterene-hydrochlorothiazide (DYAZIDE) 37.5-25 MG per capsule Take 1 capsule by mouth daily.    Historical Provider, MD    Family History No family history on file.  Social History Social History  Substance Use Topics  . Smoking status: Never Smoker  . Smokeless tobacco: Never Used  . Alcohol use No     Allergies   Tetanus toxoids  Review of Systems Review of Systems  Constitutional: Negative for diaphoresis, fever and malaise/fatigue.  Respiratory: Negative for shortness of breath.   Cardiovascular: Negative for palpitations, orthopnea, syncope and near-syncope.  Gastrointestinal: Negative for anorexia, nausea and vomiting.  Musculoskeletal: Negative for neck stiffness.  Neurological: Positive for headaches. Negative for dizziness, tremors, seizures, syncope, facial asymmetry, speech difficulty, weakness, light-headedness and numbness.  Hematological: Negative for adenopathy.  All other systems reviewed and are  negative.    Physical Exam Updated Vital Signs BP 144/98 (BP Location: Right Arm)   Pulse 80   Temp 98.3 F (36.8 C) (Oral)   Resp 22   Ht '5\' 9"'$  (1.753 m)   Wt (!) 501 lb (227.3 kg)   SpO2 100%   BMI 73.98 kg/m   Physical Exam  Constitutional: She is oriented to person, place, and time. She appears well-developed and well-nourished. No distress.  HENT:  Head: Normocephalic and atraumatic.  Right Ear: External ear normal.  Left Ear: External ear normal.  Mouth/Throat: No oropharyngeal exudate.  Eyes: Conjunctivae and EOM are normal. Pupils are equal, round, and reactive to light.  Neck: Normal range of motion. Neck supple. No JVD present.  Muscle spasm right trapezius  Cardiovascular: Normal rate, regular rhythm and intact distal pulses.   Pulmonary/Chest: Effort normal and breath sounds normal. No stridor. She has no wheezes. She has no rales.  Abdominal: Soft. Bowel sounds are normal. There is no tenderness. There is no rebound and no guarding.  Musculoskeletal: Normal range of motion. She exhibits no edema or tenderness.  Muscle spasm right trapezius  Neurological: She is alert and oriented to person, place, and time. No cranial nerve deficit.  Skin: Skin is warm and dry. Capillary refill takes less than 2 seconds. No pallor.  Psychiatric: She has a normal mood and affect.     ED Treatments / Results   Vitals:   12/10/16 2345  BP: 144/98  Pulse: 80  Resp: 22  Temp: 98.3 F (36.8 C)    Results for orders placed or performed during the hospital encounter of 12/11/16  Pregnancy, urine  Result Value Ref Range   Preg Test, Ur NEGATIVE NEGATIVE   Ct Head Wo Contrast  Result Date: 12/11/2016 CLINICAL DATA:  Headaches for 2 weeks with right-sided constant headache for 2 days. Nausea. No trauma. EXAM: CT HEAD WITHOUT CONTRAST TECHNIQUE: Contiguous axial images were obtained from the base of the skull through the vertex without intravenous contrast. COMPARISON:   12/18/2009 FINDINGS: Brain: No evidence of acute infarction, hemorrhage, hydrocephalus, extra-axial collection or mass lesion/mass effect. Vascular: No hyperdense vessel or unexpected calcification. Skull: Normal. Negative for fracture or focal lesion. Sinuses/Orbits: No acute finding. Other: No significant changes since previous study. IMPRESSION: No acute intracranial abnormalities. Electronically Signed   By: Lucienne Capers M.D.   On: 12/11/2016 01:26    Procedures Procedures (including critical care time)  Medications Ordered in ED Medications  metoCLOPramide (REGLAN) tablet 10 mg (not administered)  ibuprofen (ADVIL,MOTRIN) tablet 800 mg (not administered)  methocarbamol (ROBAXIN) tablet 1,000 mg (not administered)    Final Clinical Impressions(s) / ED Diagnoses  Muscle spasm: All questions answered to patient's satisfaction. Based on history and exam patient has been appropriately medically screened and emergency conditions excluded. Patient is stable for discharge at this time. Strict return precautions given for any further episodes, persistent fever, weakness or any concerns. New Prescriptions New Prescriptions   No medications on file     Amalia Edgecombe, MD 12/11/16 [phone]

## 2016-12-11 NOTE — ED Notes (Signed)
Pt to radiology for CT scan per EDP orders

## 2016-12-11 NOTE — ED Notes (Signed)
Pt states approx 2 weeks ago had HA radiating to lower neck area, states pain went away and now has returned for the past 3 days, HA primarily on Rt side. Denies Rt ear pain, but states , "my throat feels funny"

## 2016-12-11 NOTE — ED Notes (Signed)
Pt denies any vision issues or light sensitivity

## 2016-12-11 NOTE — ED Notes (Signed)
Pt states when she is standing and walking some, has some dizziness. Able to and tolerates PO fluids well. Pt states took Excedrin tab x 1 at approx 1930hrs, states has good relief from HA since then.

## 2018-10-22 IMAGING — CT CT HEAD W/O CM
3 series · 15 of 47 positions shown, 18 images · non-contrast
Comparison: 12/18/2009

CLINICAL DATA: Headaches for 2 weeks with right-sided constant
headache for 2 days. Nausea. No trauma.

EXAM:
CT HEAD WITHOUT CONTRAST
TECHNIQUE: Contiguous axial images were obtained from the base of the skull
through the vertex without intravenous contrast.

[Series 2: head wo · axial · 0.45mm/px · z∈[-251,-101]mm · 9 of 36 slices shown, 12 images]
[im 3/36  brain]
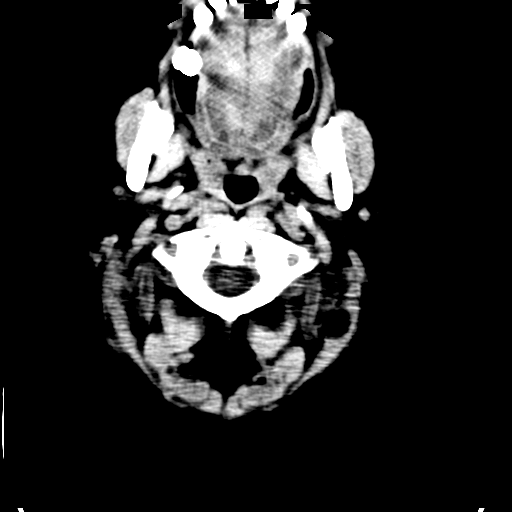
[im 3/36  bone]
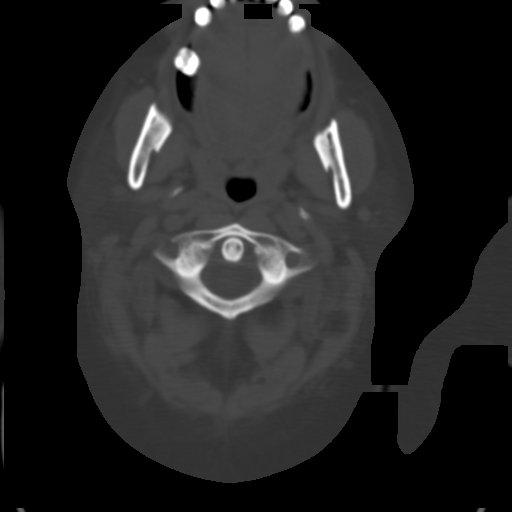
[im 7/36  brain]
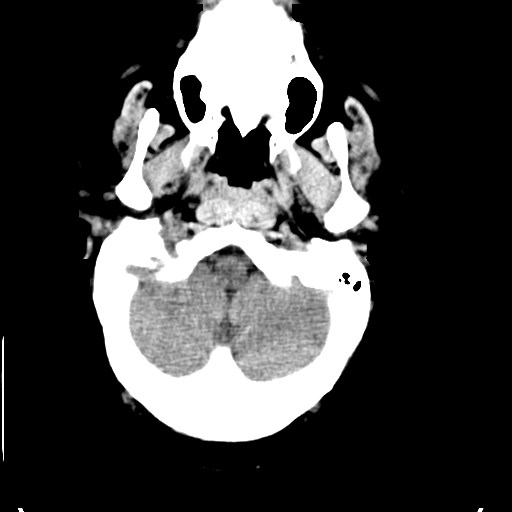
[im 10/36  brain]
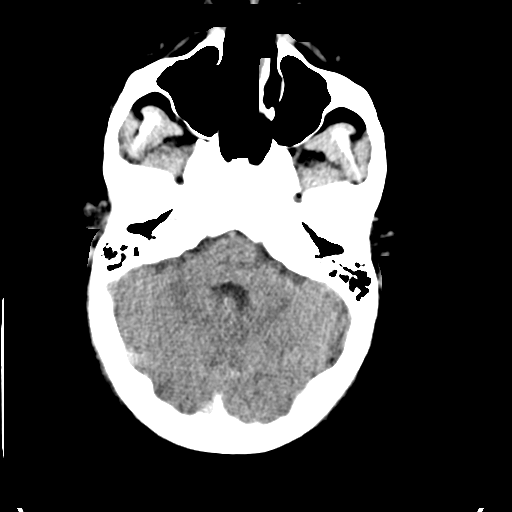
[im 14/36  brain]
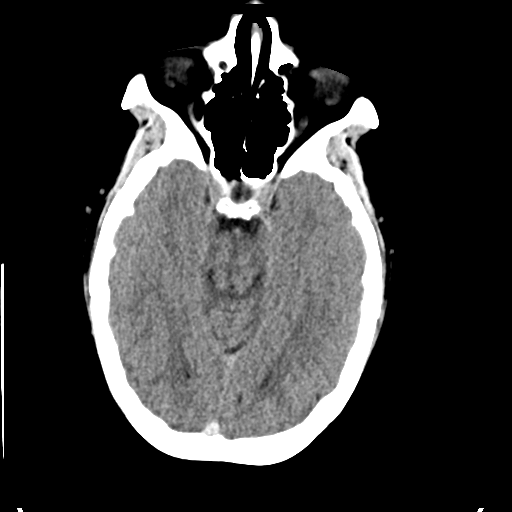
[im 19/36  brain]
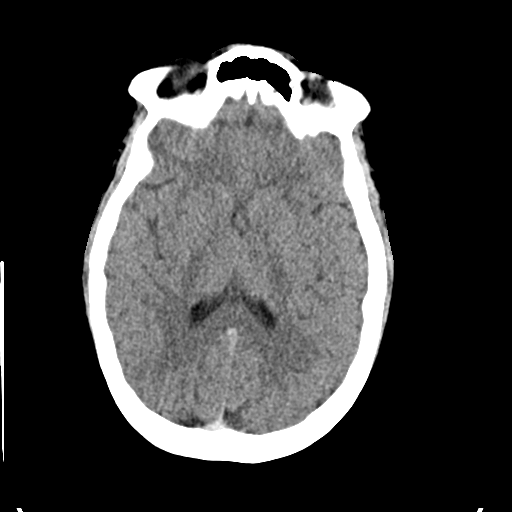
[im 19/36  bone]
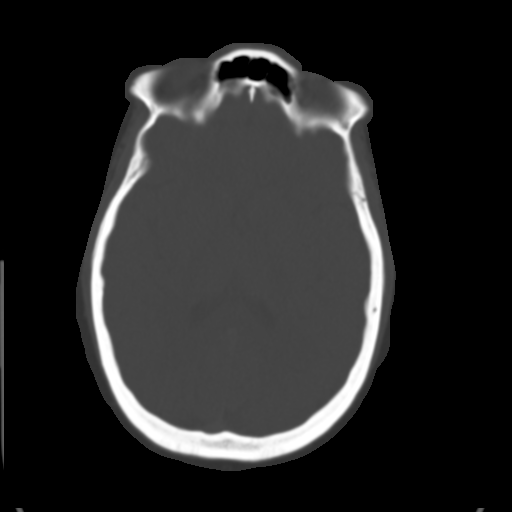
[im 22/36  brain]
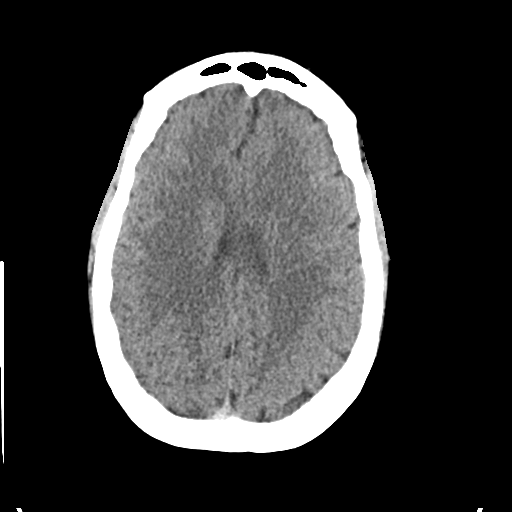
[im 26/36  brain]
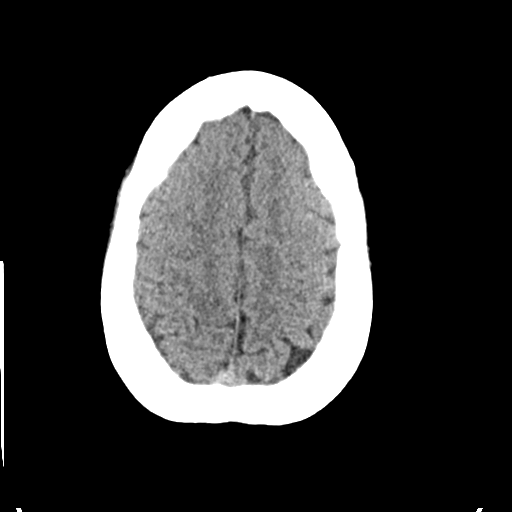
[im 29/36  brain]
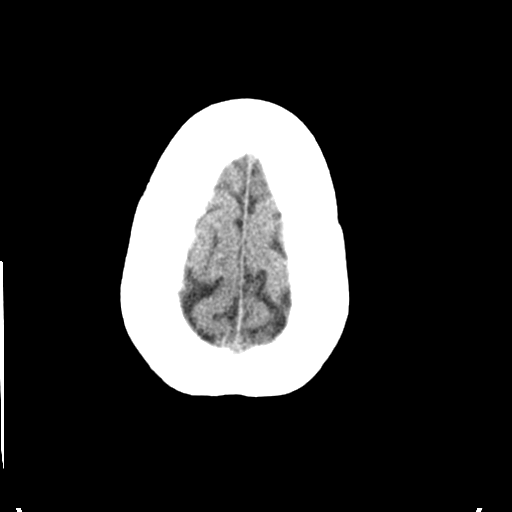
[im 33/36  brain]
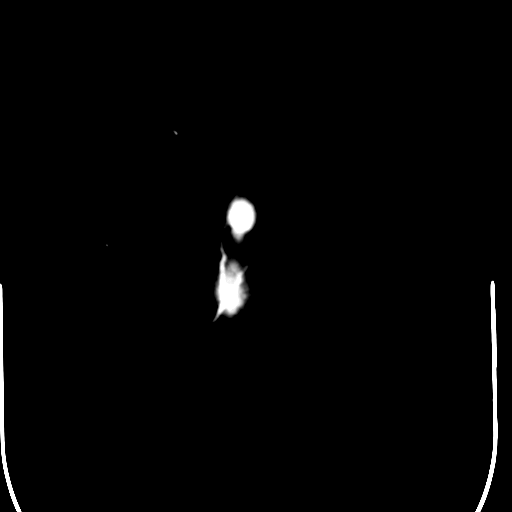
[im 33/36  bone]
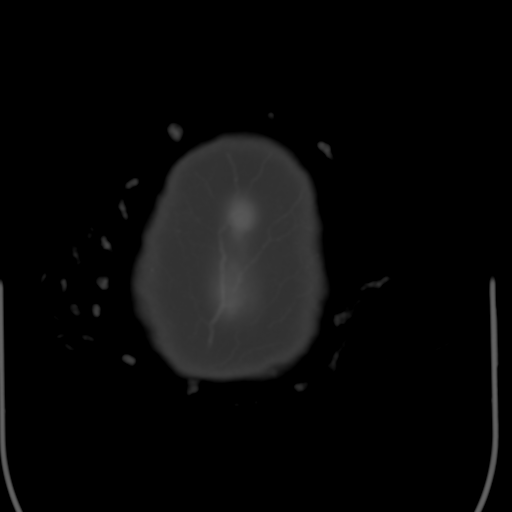

[Series 4: coronal soft · coronal · 0.35mm/px · 3 of 70 slices shown]
[im 24/70  brain]
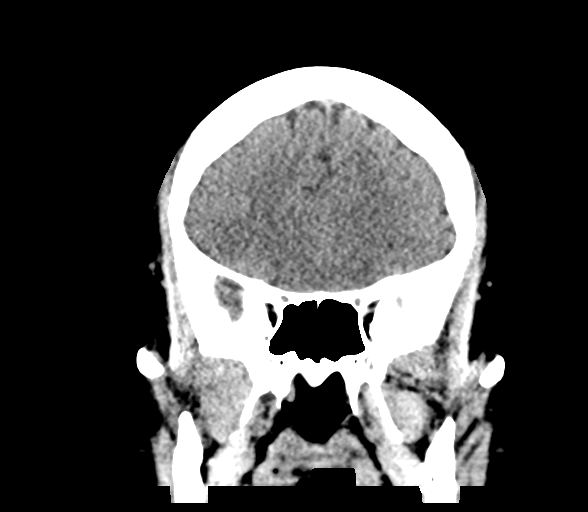
[im 31/70  brain]
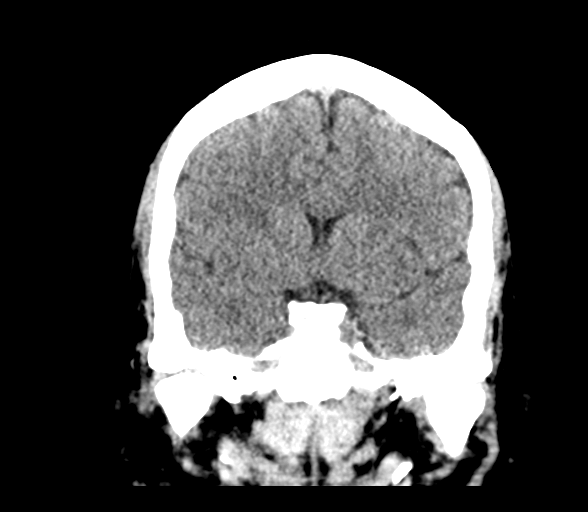
[im 39/70  brain]
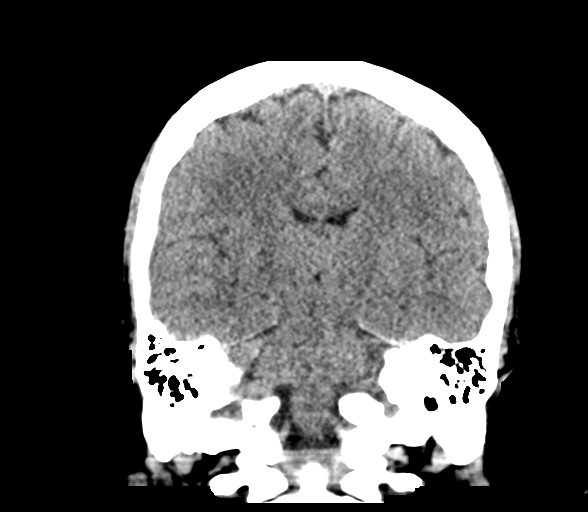

[Series 5: sag soft · sagittal · 0.32mm/px · 3 of 50 slices shown]
[im 17/50  brain]
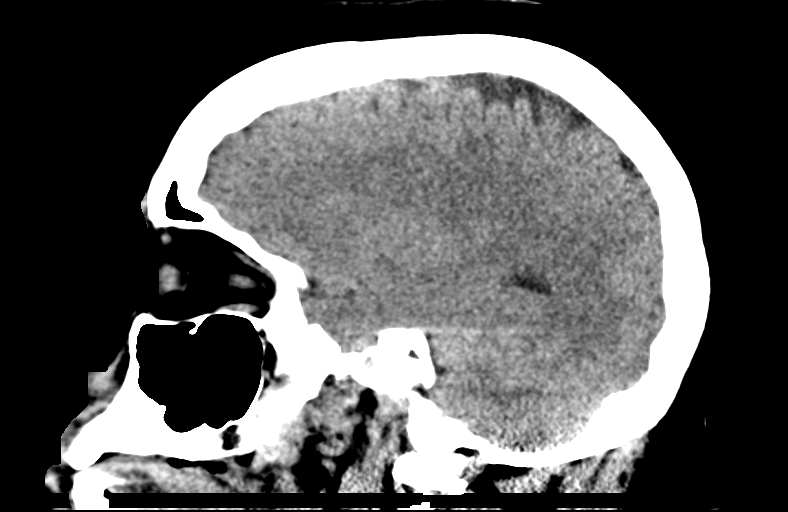
[im 25/50  brain]
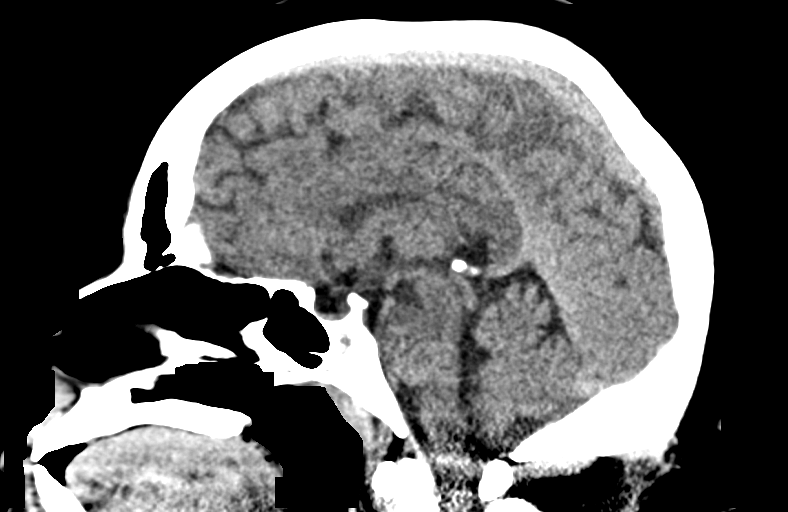
[im 33/50  brain]
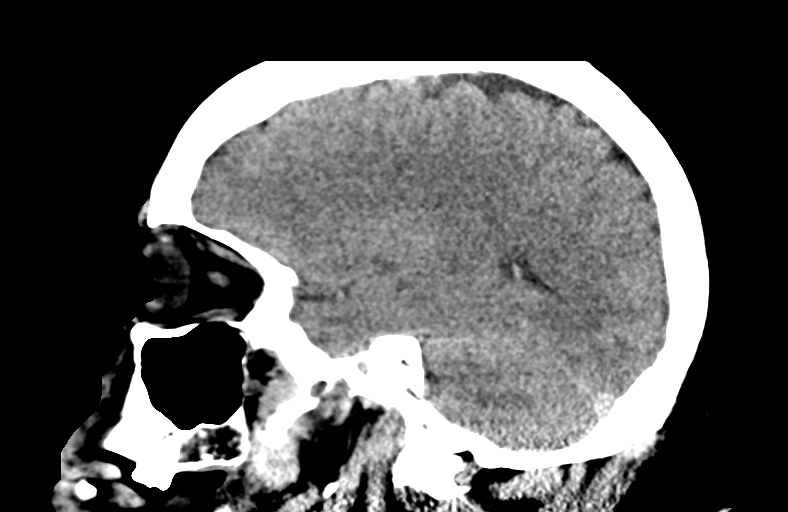

[15 of 47 positions shown; findings below may reference images not displayed]

FINDINGS: Brain: No evidence of acute infarction, hemorrhage, hydrocephalus,
extra-axial collection or mass lesion/mass effect.

Vascular: No hyperdense vessel or unexpected calcification.

Skull: Normal. Negative for fracture or focal lesion.

Sinuses/Orbits: No acute finding.

Other: No significant changes since previous study.
IMPRESSION: No acute intracranial abnormalities.

## 2024-09-27 ENCOUNTER — Other Ambulatory Visit: Payer: Self-pay

## 2024-09-27 ENCOUNTER — Emergency Department (HOSPITAL_BASED_OUTPATIENT_CLINIC_OR_DEPARTMENT_OTHER)
Admission: EM | Admit: 2024-09-27 | Discharge: 2024-09-27 | Disposition: A | Discharge status: Home / Self Care | Attending: Emergency Medicine | Admitting: Emergency Medicine

## 2024-09-27 ENCOUNTER — Encounter (HOSPITAL_BASED_OUTPATIENT_CLINIC_OR_DEPARTMENT_OTHER): Payer: Self-pay | Admitting: Emergency Medicine

## 2024-09-27 DIAGNOSIS — Z79899 Other long term (current) drug therapy: Secondary | ICD-10-CM | POA: Insufficient documentation

## 2024-09-27 DIAGNOSIS — Z794 Long term (current) use of insulin: Secondary | ICD-10-CM | POA: Insufficient documentation

## 2024-09-27 DIAGNOSIS — Z7901 Long term (current) use of anticoagulants: Secondary | ICD-10-CM | POA: Diagnosis not present

## 2024-09-27 DIAGNOSIS — K047 Periapical abscess without sinus: Secondary | ICD-10-CM | POA: Diagnosis not present

## 2024-09-27 DIAGNOSIS — I10 Essential (primary) hypertension: Secondary | ICD-10-CM | POA: Insufficient documentation

## 2024-09-27 DIAGNOSIS — E119 Type 2 diabetes mellitus without complications: Secondary | ICD-10-CM | POA: Diagnosis not present

## 2024-09-27 DIAGNOSIS — K0889 Other specified disorders of teeth and supporting structures: Secondary | ICD-10-CM | POA: Diagnosis present

## 2024-09-27 LAB — BASIC METABOLIC PANEL WITH GFR
Anion gap: 9 (ref 5–15)
BUN: 13 mg/dL (ref 6–20)
CO2: 30 mmol/L (ref 22–32)
Calcium: 9.1 mg/dL (ref 8.9–10.3)
Chloride: 96 mmol/L — ABNORMAL LOW (ref 98–111)
Creatinine, Ser: 0.92 mg/dL (ref 0.44–1.00)
GFR, Estimated: >60 mL/min (ref >60)
Glucose, Bld: 317 mg/dL — ABNORMAL HIGH (ref 70–99)
Potassium: 3.8 mmol/L (ref 3.5–5.1)
Sodium: 135 mmol/L (ref 135–145)

## 2024-09-27 LAB — CBC
HCT: 38.3 % (ref 36.0–46.0)
Hemoglobin: 11.8 g/dL — ABNORMAL LOW (ref 12.0–15.0)
MCH: 21.5 pg — ABNORMAL LOW (ref 26.0–34.0)
MCHC: 30.8 g/dL (ref 30.0–36.0)
MCV: 69.9 fL — ABNORMAL LOW (ref 80.0–100.0)
Platelets: 195 K/uL (ref 150–400)
RBC: 5.48 MIL/uL — ABNORMAL HIGH (ref 3.87–5.11)
RDW: 15.8 % — ABNORMAL HIGH (ref 11.5–15.5)
WBC: 5.5 K/uL (ref 4.0–10.5)
nRBC: 0 % (ref 0.0–0.2)

## 2024-09-27 LAB — HCG, SERUM, QUALITATIVE: Preg, Serum: NEGATIVE

## 2024-09-27 MED ORDER — LABETALOL HCL 5 MG/ML IV SOLN
10.0000 mg | Freq: Once | INTRAVENOUS | Status: AC
  Administered 2024-09-27: 10 mg via INTRAVENOUS
  Filled 2024-09-27: qty 4

## 2024-09-27 MED ORDER — CLINDAMYCIN HCL 150 MG PO CAPS: 300.0000 mg | ORAL_CAPSULE | Freq: Three times a day (TID) | ORAL | 0 refills | Status: AC

## 2024-09-27 MED ORDER — CLINDAMYCIN HCL 150 MG PO CAPS
300.0000 mg | ORAL_CAPSULE | Freq: Once | ORAL | Status: AC
  Administered 2024-09-27: 300 mg via ORAL
  Filled 2024-09-27: qty 2

## 2024-09-27 MED ORDER — AMLODIPINE BESYLATE 5 MG PO TABS
10.0000 mg | ORAL_TABLET | Freq: Once | ORAL | Status: AC
  Administered 2024-09-27: 10 mg via ORAL
  Filled 2024-09-27: qty 2

## 2024-09-27 MED ORDER — CHLORHEXIDINE GLUCONATE 0.12 % MT SOLN: 15.0000 mL | Freq: Two times a day (BID) | OROMUCOSAL | 0 refills | Status: AC

## 2024-09-27 MED ORDER — CLINDAMYCIN HCL 150 MG PO CAPS: 300.0000 mg | ORAL_CAPSULE | Freq: Four times a day (QID) | ORAL | 0 refills | Status: DC

## 2024-09-27 NOTE — ED Provider Notes (Signed)
 [place] [facility] Provider Note   CSN: [id number] Arrival date & time: 09/27/24  1551     History   Chief Complaint Chief Complaint  Patient presents with   Dental Pain    HPI Rhonda Roth is a 43 y.o. female.  Patient presents to the emergency department with a dental complaint. Symptoms began . The patient has tried to alleviate pain with tylenol .  Pain rated as severe, characterized as throbbing in nature and located right lower gumline. Patient denies fever, night sweats, chills, difficulty swallowing or opening mouth, SOB, nuchal rigidity or decreased ROM of neck.  Patient does not have a dentist and requests a resource guide at discharge.       Dental Pain   Past Medical History:  Diagnosis Date   Diabetes mellitus without complication ([facility])    Hypertension    Hypoxia 04/2015   Obesities, morbid [facility])     Patient Active Problem List   Diagnosis Date Noted   Hypokalemia 04/14/2015   Hypoxia 04/11/2015   Acute pulmonary embolism ([facility]) 04/11/2015   Morbid obesity ([facility]) 04/11/2015   DM type 2 (diabetes mellitus, type 2) ([facility]) 04/11/2015   Acute respiratory failure with hypoxia ([facility]) 04/11/2015    Past Surgical History:  Procedure Laterality Date   OOPHORECTOMY       OB History   No obstetric history on file.      Home Medications    Prior to Admission medications   Medication Sig Start Date End Date Taking? Authorizing Provider  chlorhexidine  (PERIDEX ) 0.12 % solution Use as directed 15 mLs in the mouth or throat 2 (two) times daily. 09/27/24  Yes Mikaela Hilgeman S, PA  Blood Pressure Monitoring (ADULT BLOOD PRESSURE CUFF LG) KIT Check your blood pressure daily. Call the clinic if 140/90. 12/16/13   [provider]  clindamycin  (CLEOCIN ) 150 MG capsule Take 2 capsules (300 mg total) by mouth 3 (three) times daily for 10 days. 09/27/24 10/07/24  Neldon Hamp RAMAN, PA  diphenhydrAMINE  (BENADRYL ) 25 mg  capsule Take 1 capsule (25 mg total) by mouth every 6 (six) hours as needed. 06/27/16   Nevelyn Nat Norris, PA-C  famotidine  (PEPCID ) 20 MG tablet Take 1 tablet (20 mg total) by mouth 2 (two) times daily. 06/27/16   Nevelyn Nat Norris, PA-C  insulin  aspart (NOVOLOG ) 100 UNIT/ML injection Inject 10 Units into the skin 3 (three) times daily before meals. Sliding scale. 04/14/15   Gabriel Noa, MD  insulin  detemir (LEVEMIR ) 100 unit/ml SOLN Inject 0.6 mLs (60 Units total) into the skin at bedtime. 04/14/15   Gabriel Noa, MD  Insulin  Syringe-Needle U-100 (INSULIN  SYRINGE 1CC/30GX5/16) 30G X 5/16 1 ML MISC Use to inject insulin  4 times a day. 04/04/15   [provider]  methocarbamol  (ROBAXIN ) 500 MG tablet Take 1 tablet (500 mg total) by mouth 2 (two) times daily. 12/11/16   Palumbo, April, MD  naproxen  (NAPROSYN ) 375 MG tablet Take 1 tablet (375 mg total) by mouth 2 (two) times daily. 12/11/16   Palumbo, April, MD  ramipril  (ALTACE ) 10 MG capsule Take 10 mg by mouth daily.    [provider]  ReliOn Ultra Thin Lancets MISC Use to check glucose up to 4 times a day.  Note:dx: 250.01 04/04/15   [provider]  Rivaroxaban  (XARELTO  STARTER PACK) 15 & 20 MG TBPK Take as directed on package: Start with one 15mg  tablet by mouth twice a day with food. On Day 22, switch  to one 20mg  tablet once a day with food. 04/14/15   Gabriel Noa, MD  triamterene-hydrochlorothiazide (DYAZIDE) 37.5-25 MG per capsule Take 1 capsule by mouth daily.    [provider]    Family History History reviewed. No pertinent family history.  Social History Social History   Tobacco Use   Smoking status: Never   Smokeless tobacco: Never  Substance Use Topics   Alcohol use: No   Drug use: No     Allergies   Tetanus toxoid-containing vaccines   Review of Systems Denies fevers, chills, difficulty swallowing or eating, changes in voice, pain under tongue, nausea, vomiting,  lightheadedness or dizziness. No trismus   Physical Exam Updated Vital Signs BP (!) 175/93   Pulse 78   Temp 99.1 F (37.3 C) (Oral)   Resp 20   Ht 5' 9 (1.753 m)   Wt (!) 199.6 kg   SpO2 92%   BMI 64.98 kg/m   Physical Exam Physical Exam  Constitutional: Pt appears well-developed and well-nourished.  HENT:  Head: Normocephalic.  Right Ear: Tympanic membrane, external ear and ear canal normal.  Left Ear: Tympanic membrane, external ear and ear canal normal.  Nose: Nose normal. Right sinus exhibits no maxillary sinus tenderness and no frontal sinus tenderness. Left sinus exhibits no maxillary sinus tenderness and no frontal sinus tenderness.  Mouth/Throat: Uvula is midline, oropharynx is clear and moist and mucous membranes are normal. No oral lesions. No uvula swelling or lacerations. No oropharyngeal exudate, posterior oropharyngeal edema, posterior oropharyngeal erythema or tonsillar abscesses.  Poor dentition, numerous missing teeth on lower gumline -- no teeth upper gumline.  Small abscess w scant purulence.  No sublingual edema, tenderness to palpation, or sign of Ludwig's angina, or deep space infection Pain at right lower gumline  Eyes: Conjunctivae are normal. Pupils are equal, round, and reactive to light. Right eye exhibits no discharge. Left eye exhibits no discharge.  Neck: Normal range of motion. Neck supple.  No stridor Handling secretions without difficulty No nuchal rigidity No cervical lymphadenopathy Cardiovascular: Normal rate, regular rhythm and normal heart sounds.   Pulmonary/Chest: Effort normal. No respiratory distress.  Equal chest rise  Abdominal: Soft. Bowel sounds are normal. Pt exhibits no distension. There is no tenderness.  Lymphadenopathy: Pt has no cervical adenopathy.  Neurological: Pt is alert and oriented x 4  Skin: Skin is warm and dry.  Psychiatric: Pt has a normal mood and affect.  Nursing note and vitals reviewed.   ED Treatments /  Results  Labs (all labs ordered are listed, but only abnormal results are displayed) Labs Reviewed  CBC - Abnormal; Notable for the following components:      Result Value   RBC 5.48 (*)    Hemoglobin 11.8 (*)    MCV 69.9 (*)    MCH 21.5 (*)    RDW 15.8 (*)    All other components within normal limits  BASIC METABOLIC PANEL WITH GFR - Abnormal; Notable for the following components:   Chloride 96 (*)    Glucose, Bld 317 (*)    All other components within normal limits  HCG, SERUM, QUALITATIVE    EKG    Radiology No results found.  Procedures Procedures (including critical care time)  Medications Ordered in ED Medications  labetalol  (NORMODYNE ) injection 10 mg (10 mg Intravenous Given 09/27/24 1748)  clindamycin  (CLEOCIN ) capsule 300 mg (300 mg Oral Given 09/27/24 1740)  amLODipine  (NORVASC ) tablet 10 mg (10 mg Oral Given 09/27/24 1903)  labetalol  (  NORMODYNE ) injection 10 mg (10 mg Intravenous Given 09/27/24 1904)     Initial Impression / Assessment and Plan / ED Course  I have reviewed the triage vital signs and the nursing notes.  Pertinent labs & imaging results that were available during my care of the patient were reviewed by me and considered in my medical decision making (see chart for details).        Patient with dentalgia.  No abscess requiring immediate incision and drainage.  Exam not concerning for Ludwig's angina or pharyngeal abscess.  Will treat with clindamycin . Pt instructed to follow-up with dentist.  Discussed return precautions. Pt safe for discharge.   Final Clinical Impressions(s) / ED Diagnoses   Final diagnoses:  Dental abscess    ED Discharge Orders          Ordered    clindamycin  (CLEOCIN ) 150 MG capsule  Every 6 hours,   Status:  Discontinued        09/27/24 1951    chlorhexidine  (PERIDEX ) 0.12 % solution  2 times daily        09/27/24 1952    clindamycin  (CLEOCIN ) 150 MG capsule  3 times daily        09/27/24 1953               Neldon Inoue [place], [place] 09/27/24 2259    Dreama Longs, MD 09/28/24 [phone]

## 2024-09-27 NOTE — Discharge Instructions (Addendum)
 You have a dental infection which is currently draining some pus.  Irrigate with the Peridex  mouth solution I prescribed you twice daily swish and spit.  Clindamycin  you will take every 8 hours or 3 times daily with breakfast lunch and dinner.  Take until you are completely done with this medication.  You also have extremely elevated blood pressure and your blood sugar was elevated as well. It is very important for you to continue to manage your blood pressure and your high blood sugar.  You will ultimately end up having either a stroke or heart attack or developing renal failure--which will result in you being on dialysis--if we continue on the current track we are on.  Please follow-up with your primary care doctor.  Please follow-up with your dentist as well.  I have given you clindamycin  which is an antibiotic.  Take the full course even if your symptoms improved.  If you do not have a dentist he can follow-up with the dentist this who I am giving you information for.  PLEASE TAKE YOUR INSULIN  AND BLOOD PRESSURE MEDICINE.   Please take all of your antibiotics until finished!   You may develop abdominal discomfort or diarrhea from the antibiotic.  You may help offset this with probiotics which you can buy or get in yogurt. Do not eat  or take the probiotics until 2 hours after your antibiotic.

## 2024-09-27 NOTE — ED Triage Notes (Addendum)
 Pt reports broken tooth on R lower jaw 1.5 months.  New pain x 2 weeks. Seen by dentist, given antibiotic, told elevated bp and they couldn't pull tooth.   Did not finish antibiotic prescription due to increased facial swelling.  Denies fever.  Reports noncompliance with bp meds lately
